# Patient Record
Sex: Male | Born: 1990 | Race: Black or African American | Hispanic: No | Marital: Single | Smoking: Current every day smoker
Health system: Southern US, Community
[De-identification: ages and names within clinical notes are randomized; demographics above are authoritative.]

## PROBLEM LIST (undated history)

## (undated) DIAGNOSIS — I10 Essential (primary) hypertension: Secondary | ICD-10-CM

## (undated) DIAGNOSIS — I517 Cardiomegaly: Secondary | ICD-10-CM

## (undated) DIAGNOSIS — S83519A Sprain of anterior cruciate ligament of unspecified knee, initial encounter: Secondary | ICD-10-CM

## (undated) DIAGNOSIS — J45909 Unspecified asthma, uncomplicated: Secondary | ICD-10-CM

## (undated) HISTORY — PX: ANTERIOR CRUCIATE LIGAMENT REPAIR: SHX115

---

## 2010-08-06 ENCOUNTER — Emergency Department (HOSPITAL_COMMUNITY)
Admission: EM | Admit: 2010-08-06 | Discharge: 2010-08-06 | Payer: Self-pay | Source: Home / Self Care | Admitting: Emergency Medicine

## 2011-09-12 ENCOUNTER — Emergency Department (HOSPITAL_COMMUNITY)
Admission: EM | Admit: 2011-09-12 | Discharge: 2011-09-12 | Disposition: A | Discharge status: Home / Self Care | Payer: Medicaid Other | Attending: Emergency Medicine | Admitting: Emergency Medicine

## 2011-09-12 ENCOUNTER — Encounter (HOSPITAL_COMMUNITY): Payer: Self-pay | Admitting: *Deleted

## 2011-09-12 DIAGNOSIS — F17200 Nicotine dependence, unspecified, uncomplicated: Secondary | ICD-10-CM | POA: Insufficient documentation

## 2011-09-12 DIAGNOSIS — M25569 Pain in unspecified knee: Secondary | ICD-10-CM | POA: Insufficient documentation

## 2011-09-12 DIAGNOSIS — M25561 Pain in right knee: Secondary | ICD-10-CM

## 2011-09-12 MED ORDER — ACETAMINOPHEN 500 MG PO TABS
1000.0000 mg | ORAL_TABLET | Freq: Once | ORAL | Status: DC
  Filled 2011-09-12: qty 2

## 2011-09-12 MED ORDER — ACETAMINOPHEN 325 MG PO TABS
975.0000 mg | ORAL_TABLET | Freq: Once | ORAL | Status: AC
  Administered 2011-09-12: 975 mg via ORAL
  Filled 2011-09-12: qty 3

## 2011-09-12 NOTE — ED Notes (Signed)
Painful rt knee for 2 weeks .  No injury

## 2011-09-12 NOTE — ED Provider Notes (Signed)
History     CSN: [id number]  Arrival date & time 09/12/11  1544   First MD Initiated Contact with Patient 09/12/11 1620      Chief Complaint  Patient presents with  . Knee Pain    (Consider location/radiation/quality/duration/timing/severity/associated sxs/prior treatment) HPI Complains of right knee pain nonradiating for the past 2 weeks no injury pain is worse with walking pain is nonradiating improved with remaining still mild at present no fever patient feels his knee is unstable when he walks stating "it wants to give out" no treatment prior to coming here no other associated symptoms History reviewed. No pertinent past medical history.  History reviewed. No pertinent past surgical history. Anterior cruciate ligament repair, right side; right hamstring repair No family history on file.  History  Substance Use Topics  . Smoking status: Current Everyday Smoker  . Smokeless tobacco: Not on file  . Alcohol Use: No      Review of Systems  Constitutional: Negative.   Eyes: Negative.   Musculoskeletal:       Arthralgias right knee otherwise negative  Neurological: Negative for numbness.  Hematological: Negative.   Psychiatric/Behavioral: Negative.     Allergies  Review of patient's allergies indicates no known allergies.  Home Medications  No current outpatient prescriptions on file.  BP 139/84  Pulse 77  Temp(Src) 98.2 F (36.8 C) (Oral)  Resp 18  SpO2 95%  Physical Exam  Nursing note and vitals reviewed. Constitutional: He appears well-developed and well-nourished. No distress.  Eyes: EOM are normal.  Neck: Neck supple.  Cardiovascular: Normal rate.   Pulmonary/Chest: Effort normal.  Abdominal:       obese  Musculoskeletal: Normal range of motion.       Right lower extremity without redness swelling or tenderness no ligamentous laxity at knee negative Lachman sign or posterior drawer sign; neurovascularly intact no tenderness; all other extremities  nontender neurovascularly intact  Neurological:       Gait normal walks without limp  Psychiatric: He has a normal mood and affect. His behavior is normal. Thought content normal.    ED Course  Procedures (including critical care time)  Labs Reviewed - No data to display No results found.   No diagnosis found.    MDM  Imaging not indicated patient agrees Patient requests only Tylenol or Advil for pain; requests orthopedic referral Diagnosis right knee pain        Doug Sou, MD 09/12/11 1646

## 2012-03-21 ENCOUNTER — Emergency Department (HOSPITAL_COMMUNITY): Payer: Medicaid Other

## 2012-03-21 ENCOUNTER — Encounter (HOSPITAL_COMMUNITY): Payer: Self-pay | Admitting: Emergency Medicine

## 2012-03-21 ENCOUNTER — Emergency Department (HOSPITAL_COMMUNITY)
Admission: EM | Admit: 2012-03-21 | Discharge: 2012-03-22 | Disposition: A | Discharge status: Home / Self Care | Payer: Medicaid Other | Attending: Emergency Medicine | Admitting: Emergency Medicine

## 2012-03-21 DIAGNOSIS — F17200 Nicotine dependence, unspecified, uncomplicated: Secondary | ICD-10-CM | POA: Insufficient documentation

## 2012-03-21 DIAGNOSIS — J45909 Unspecified asthma, uncomplicated: Secondary | ICD-10-CM | POA: Insufficient documentation

## 2012-03-21 DIAGNOSIS — Y9367 Activity, basketball: Secondary | ICD-10-CM | POA: Insufficient documentation

## 2012-03-21 DIAGNOSIS — S8991XA Unspecified injury of right lower leg, initial encounter: Secondary | ICD-10-CM

## 2012-03-21 DIAGNOSIS — S8990XA Unspecified injury of unspecified lower leg, initial encounter: Secondary | ICD-10-CM | POA: Insufficient documentation

## 2012-03-21 DIAGNOSIS — X58XXXA Exposure to other specified factors, initial encounter: Secondary | ICD-10-CM | POA: Insufficient documentation

## 2012-03-21 HISTORY — DX: Unspecified asthma, uncomplicated: J45.909

## 2012-03-21 NOTE — ED Notes (Signed)
Pt [place] basketball court after c/o pain to R knee after playing basketball. Previous ACL on same, ambulatory to [facility]

## 2012-03-22 MED ORDER — KETOROLAC TROMETHAMINE 60 MG/2ML IM SOLN
60.0000 mg | Freq: Once | INTRAMUSCULAR | Status: AC
  Administered 2012-03-22: 60 mg via INTRAMUSCULAR
  Filled 2012-03-22: qty 2

## 2012-03-22 MED ORDER — MELOXICAM 7.5 MG PO TABS: 7.5000 mg | ORAL_TABLET | Freq: Every day | ORAL | Status: DC

## 2012-03-22 NOTE — ED Notes (Addendum)
Pt reports tearing right ACL 2 years ago. Pt reports walking and felt his knee popped out of place and couldn't pop it back in place.

## 2012-03-22 NOTE — ED Notes (Signed)
Called pt x 1, no answer 

## 2012-03-22 NOTE — ED Provider Notes (Signed)
History     CSN: [id number]  Arrival date & time 03/21/12  2046   First MD Initiated Contact with Patient 03/22/12 0214      Chief Complaint  Patient presents with  . Knee Injury    (Consider location/radiation/quality/duration/timing/severity/associated sxs/prior treatment) HPI Comments: 21 year old male with a history of anterior cruciate ligament repair in the past presents with right knee pain after playing basketball and feeling his knee pop. The pain is persistent, mild, worse with range of motion, not associated with swelling numbness or tingling. This is the same knee that had the injury in the past.  The history is provided by the patient.    Past Medical History  Diagnosis Date  . Asthma     Past Surgical History  Procedure Date  . Anterior cruciate ligament repair     R Knee    No family history on file.  History  Substance Use Topics  . Smoking status: Current Everyday Smoker  . Smokeless tobacco: Not on file  . Alcohol Use: No      Review of Systems  Musculoskeletal: Positive for gait problem. Negative for joint swelling.  Neurological: Negative for weakness and numbness.    Allergies  Review of patient's allergies indicates no known allergies.  Home Medications   Current Outpatient Rx  Name Route Sig Dispense Refill  . MELOXICAM 7.5 MG PO TABS Oral Take 1 tablet (7.5 mg total) by mouth daily. 30 tablet 0    BP 119/68  Pulse 96  Temp 99 F (37.2 C) (Oral)  Resp 16  SpO2 97%  Physical Exam  Nursing note and vitals reviewed. Constitutional: He appears well-developed and well-nourished. No distress.  HENT:  Head: Normocephalic and atraumatic.  Eyes: Conjunctivae are normal. No scleral icterus.  Cardiovascular: Intact distal pulses.   Pulmonary/Chest: Effort normal.  Musculoskeletal: He exhibits tenderness ( No anterior or posterior drawer laxity, pain with range of motion of the knee but has a supple knee that flexes and extends to  normal range of motion.). He exhibits no edema.  Neurological: He is alert.  Skin: Skin is warm and dry. No rash noted. He is not diaphoretic.    ED Course  Procedures (including critical care time)  Labs Reviewed - No data to display Dg Knee Complete 4 Views Right  03/21/2012  *RADIOLOGY REPORT*  Clinical Data: Knee pain, history of ACL repair  RIGHT KNEE - COMPLETE 4+ VIEW  Comparison: None.  Findings: Postop changes from ACL repair.  Negative for fracture or effusion.  Small ossified densities along the central aspect of the joint along the tibial spines, loose articular joint bodies not excluded.  IMPRESSION: Previous Biochemist, clinical.  Question small loose articular joint bodies centrally.  Negative for fracture or effusion.  Original Report Authenticated By: Judie Petit. Ruel Favors, M.D.     1. Right knee injury       MDM  Patient has some difficulty with ambulating secondary to pain in the knee, x-rays reviewed showing no signs of acute fractures or dislocations, knee appear stable without significant effusion, orthopedic followup, rice therapy, nonsteroidal medications.  Discharge Prescriptions include:  meloxican          Vida Roller, MD 03/22/12 [phone]

## 2012-03-22 NOTE — ED Notes (Signed)
Called for pt x 2 in [facility] for revitalization, no answer

## 2013-02-01 ENCOUNTER — Encounter (HOSPITAL_COMMUNITY): Payer: Self-pay | Admitting: Emergency Medicine

## 2013-02-01 ENCOUNTER — Emergency Department (HOSPITAL_COMMUNITY)
Admission: EM | Admit: 2013-02-01 | Discharge: 2013-02-01 | Disposition: A | Discharge status: Home / Self Care | Payer: Medicaid Other | Attending: Emergency Medicine | Admitting: Emergency Medicine

## 2013-02-01 ENCOUNTER — Emergency Department (HOSPITAL_COMMUNITY): Payer: Medicaid Other

## 2013-02-01 DIAGNOSIS — M25561 Pain in right knee: Secondary | ICD-10-CM

## 2013-02-01 DIAGNOSIS — S8990XA Unspecified injury of unspecified lower leg, initial encounter: Secondary | ICD-10-CM | POA: Insufficient documentation

## 2013-02-01 DIAGNOSIS — Y929 Unspecified place or not applicable: Secondary | ICD-10-CM | POA: Insufficient documentation

## 2013-02-01 DIAGNOSIS — Y939 Activity, unspecified: Secondary | ICD-10-CM | POA: Insufficient documentation

## 2013-02-01 DIAGNOSIS — R296 Repeated falls: Secondary | ICD-10-CM | POA: Insufficient documentation

## 2013-02-01 DIAGNOSIS — J45909 Unspecified asthma, uncomplicated: Secondary | ICD-10-CM | POA: Insufficient documentation

## 2013-02-01 DIAGNOSIS — S99929A Unspecified injury of unspecified foot, initial encounter: Secondary | ICD-10-CM | POA: Insufficient documentation

## 2013-02-01 DIAGNOSIS — F17200 Nicotine dependence, unspecified, uncomplicated: Secondary | ICD-10-CM | POA: Insufficient documentation

## 2013-02-01 DIAGNOSIS — Z9889 Other specified postprocedural states: Secondary | ICD-10-CM | POA: Insufficient documentation

## 2013-02-01 MED ORDER — NAPROXEN 500 MG PO TABS: 500.0000 mg | ORAL_TABLET | Freq: Two times a day (BID) | ORAL | Status: DC

## 2013-02-01 MED ORDER — HYDROCODONE-ACETAMINOPHEN 5-325 MG PO TABS: 1.0000 | ORAL_TABLET | Freq: Four times a day (QID) | ORAL | Status: DC | PRN

## 2013-02-01 NOTE — ED Notes (Signed)
Per EMS.  Pt fell and hurt knee.  Pt able to walk on knee.  Pt told EMs he felt knee pop, but states his knee pops normally after als surgery several years ago.

## 2013-02-01 NOTE — ED Provider Notes (Signed)
History     CSN: [id number]  Arrival date & time 02/01/13  1344   First MD Initiated Contact with Patient 02/01/13 1408      Chief Complaint  Patient presents with  . Knee Pain    (Consider location/radiation/quality/duration/timing/severity/associated sxs/prior treatment) HPI Comments: Patient presents with right knee pain that has been present since he fell on his knee just prior to arrival.  He reports that his knee has been increasingly unstable.  Today his knee gave out and he fell to the ground and landed on his knee.  Knee pain worse with ROM of the knee.  He denies any swelling or erythema of the knee.  Denies numbness or tingling.  He has been ambulatory since the fall.  He has not taken anything for pain prior to arrival in the ED.   PMH significant for ACL repair surgery of the right knee six years ago.  Surgery was performed in [place].   The history is provided by the patient.    Past Medical History  Diagnosis Date  . Asthma     Past Surgical History  Procedure Laterality Date  . Anterior cruciate ligament repair      R Knee    History reviewed. No pertinent family history.  History  Substance Use Topics  . Smoking status: Current Every Day Smoker  . Smokeless tobacco: Not on file  . Alcohol Use: No      Review of Systems  All other systems reviewed and are negative.    Allergies  Review of patient's allergies indicates no known allergies.  Home Medications  No current outpatient prescriptions on file.  BP 140/88  Pulse 75  Temp(Src) 98 F (36.7 C) (Oral)  Resp 18  SpO2 98%  Physical Exam  Nursing note and vitals reviewed. Constitutional: He appears well-developed and well-nourished. No distress.  HENT:  Head: Normocephalic and atraumatic.  Cardiovascular: Normal rate, regular rhythm, normal heart sounds and intact distal pulses.   Pulmonary/Chest: Effort normal and breath sounds normal.  Musculoskeletal:       Right knee: He  exhibits normal range of motion, no swelling, no effusion, no ecchymosis, no deformity, no laceration, no erythema, no LCL laxity and no MCL laxity. Tenderness found. Medial joint line and lateral joint line tenderness noted.  Mild laxity with Anterior Drawer of both knees.  Neurological: He is alert.  Skin: Skin is warm and dry. He is not diaphoretic. No erythema.  Psychiatric: He has a normal mood and affect.    ED Course  Procedures (including critical care time)  Labs Reviewed - No data to display Dg Knee Complete 4 Views Right  02/01/2013   *RADIOLOGY REPORT*  Clinical Data: Right knee pain for 2 weeks.  History of prior ACL repair.  RIGHT KNEE - COMPLETE 4+ VIEW  Comparison: Plain films 03/21/2012.  Findings: Postoperative change of ACL grafting is again seen. There is no acute bony or joint abnormality.  Loose bodies are again seen within the joint centrally and in the medial compartment.  There is no joint effusion.  IMPRESSION:  1.  No acute finding. 2.  Status post ACL grafting with some loose bodies in the joint. The appearance is stable compared to the prior exam.   Original Report Authenticated By: Holley Dexter, M.D.     No diagnosis found.    MDM  Patient presenting with knee pain after falling earlier today.  He also reports that his knee has been more unstable.  PMH  significant for ACL surgery.  Xray negative.  Neurovascularly intact.  Patient discharged home with short course of pain medication, knee brace, and Orthopedic referral.          Magnus Sinning, PA-C 02/02/13 1228  Pascal Lux Lugoff, PA-C 02/02/13 1229  Pascal Lux [place], [place] 02/02/13 1229

## 2013-02-01 NOTE — ED Notes (Signed)
Unable to discharge, waiting for application of knee sleeve.

## 2013-02-02 NOTE — ED Provider Notes (Signed)
Medical screening examination/treatment/procedure(s) were performed by non-physician practitioner and as supervising physician I was immediately available for consultation/collaboration.  Fatema Rabe, MD 02/02/13 1411 

## 2013-02-07 ENCOUNTER — Emergency Department (HOSPITAL_COMMUNITY)
Admission: EM | Admit: 2013-02-07 | Discharge: 2013-02-07 | Disposition: A | Discharge status: Home / Self Care | Payer: Medicaid Other | Attending: Emergency Medicine | Admitting: Emergency Medicine

## 2013-02-07 ENCOUNTER — Encounter (HOSPITAL_COMMUNITY): Payer: Self-pay | Admitting: Emergency Medicine

## 2013-02-07 DIAGNOSIS — J45909 Unspecified asthma, uncomplicated: Secondary | ICD-10-CM | POA: Insufficient documentation

## 2013-02-07 DIAGNOSIS — Y929 Unspecified place or not applicable: Secondary | ICD-10-CM | POA: Insufficient documentation

## 2013-02-07 DIAGNOSIS — IMO0002 Reserved for concepts with insufficient information to code with codable children: Secondary | ICD-10-CM | POA: Insufficient documentation

## 2013-02-07 DIAGNOSIS — Y9361 Activity, american tackle football: Secondary | ICD-10-CM | POA: Insufficient documentation

## 2013-02-07 DIAGNOSIS — R229 Localized swelling, mass and lump, unspecified: Secondary | ICD-10-CM | POA: Insufficient documentation

## 2013-02-07 DIAGNOSIS — M25569 Pain in unspecified knee: Secondary | ICD-10-CM | POA: Insufficient documentation

## 2013-02-07 DIAGNOSIS — M25561 Pain in right knee: Secondary | ICD-10-CM

## 2013-02-07 DIAGNOSIS — Z791 Long term (current) use of non-steroidal anti-inflammatories (NSAID): Secondary | ICD-10-CM | POA: Insufficient documentation

## 2013-02-07 DIAGNOSIS — F17200 Nicotine dependence, unspecified, uncomplicated: Secondary | ICD-10-CM | POA: Insufficient documentation

## 2013-02-07 DIAGNOSIS — X58XXXA Exposure to other specified factors, initial encounter: Secondary | ICD-10-CM | POA: Insufficient documentation

## 2013-02-07 MED ORDER — HYDROCODONE-ACETAMINOPHEN 5-325 MG PO TABS: 1.0000 | ORAL_TABLET | Freq: Four times a day (QID) | ORAL | Status: DC | PRN

## 2013-02-07 MED ORDER — NAPROXEN 500 MG PO TABS: 500.0000 mg | ORAL_TABLET | Freq: Two times a day (BID) | ORAL | Status: DC

## 2013-02-07 NOTE — ED Notes (Signed)
Pt presenting to ed with c/o right knee pain since high school injury pt states seen here for the same a couple days ago but pain is worse

## 2013-02-07 NOTE — ED Provider Notes (Signed)
History     CSN: [id number]  Arrival date & time 02/07/13  0721   First MD Initiated Contact with Patient 02/07/13 [phone]      Chief Complaint  Patient presents with  . Knee Pain    (Consider location/radiation/quality/duration/timing/severity/associated sxs/prior treatment) HPI  She presents emergency department chief complaint of right knee pain.  He has a past medical history of anterior cruciate ligament tear and repair.  Patient is a former high Pharmacist, community.  The patient was seen here on 02/01/2013 for the same complaint.  He was given pain medication and a knee sleeve.  Patient states that his knee pain appears to be getting worse.  He denies any heat, swelling, redness, history of gout.  The patient denies any fevers, chills, nausea, vomiting.  He has pain with full extension of the knee and full flexion of the knee.  He is ambulatory but with pain.  Past Medical History  Diagnosis Date  . Asthma     Past Surgical History  Procedure Laterality Date  . Anterior cruciate ligament repair      R Knee    No family history on file.  History  Substance Use Topics  . Smoking status: Current Every Day Smoker    Types: Cigarettes  . Smokeless tobacco: Not on file  . Alcohol Use: Yes     Comment: 1/2 gallon every 2 weeks      Review of Systems  Constitutional: Negative for fever and chills.  Respiratory: Negative for cough and shortness of breath.   Cardiovascular: Negative for chest pain and palpitations.  Gastrointestinal: Negative for vomiting, abdominal pain, diarrhea and constipation.  Genitourinary: Negative for dysuria, urgency and frequency.  Musculoskeletal: Positive for joint swelling and gait problem. Negative for myalgias and arthralgias.  Skin: Negative for rash.  Neurological: Negative for numbness and headaches.    Allergies  Review of patient's allergies indicates no known allergies.  Home Medications   Current Outpatient Rx  Name   Route  Sig  Dispense  Refill  . naproxen (NAPROSYN) 500 MG tablet   Oral   Take 1 tablet (500 mg total) by mouth 2 (two) times daily.   30 tablet   0   . HYDROcodone-acetaminophen (NORCO/VICODIN) 5-325 MG per tablet   Oral   Take 1-2 tablets by mouth every 6 (six) hours as needed for pain.   15 tablet   0     BP 140/86  Pulse 82  Temp(Src) 98.1 F (36.7 C) (Oral)  Resp 17  SpO2 100%  Physical Exam  Nursing note and vitals reviewed. Constitutional: He appears well-developed and well-nourished. No distress.  HENT:  Head: Normocephalic and atraumatic.  Eyes: Conjunctivae are normal. No scleral icterus.  Neck: Normal range of motion. Neck supple.  Cardiovascular: Normal rate, regular rhythm and normal heart sounds.   Pulmonary/Chest: Effort normal and breath sounds normal. No respiratory distress.  Abdominal: Soft. There is no tenderness.  Musculoskeletal: He exhibits no edema.  A right knee exam was performed. SKIN: intact SWELLING: minimal EFFUSION: none WARMTH: no warmth TENDERNESS:  diffuse ROM: limited by pain STRENGTH: normal CREPITUS: no NEUROLOGICAL EXAM: normal VASCULAR EXAM: normal   Neurological: He is alert.  Skin: Skin is warm and dry. He is not diaphoretic.  Psychiatric: His behavior is normal.    ED Course  Procedures (including critical care time)  Labs Reviewed - No data to display No results found.   1. Knee joint pain, right  MDM   Filed Vitals:   02/07/13 0832  BP: 140/86  Pulse:   Temp:   Resp: 17   Patient x-ray shows small bone fragments in the knee.  No signs of effusion.  This was taken on 02/01/2013.  Discussed the fact that the patient needs to followup with orthopedics.  Patient states he is referred to Dr. Audrie Lia office but was unable to obtain appointment.  He states that he was told that he was fell and could not get in.  Dr. Ophelia Charter on call today.  He is in the same practice.  I called and spoke with Ms. Cannal.   Patient has appointment tomorrow at 2:45 PM.  He must call with his Medicaid card information.  Visual (knee immobilizer.  I refill his pain medications.  He appears safe for discharge at this time.  Do not suspect a septic joint, gout, no new injuries.Arthor Captain, PA-C 02/07/13 [phone]

## 2013-02-07 NOTE — Progress Notes (Addendum)
[facility] ED CM noted pt without a pcp listed. CM spoke with pt who confirms [place] resident States his pcp is on "[address] "[facility]" and can not recall the name of the MD.  Listed in [facility] for this visit per registration with medicaid but he reports not being able to find his medicaid card.  The MD listed for the facility is george osei bonsu

## 2013-02-08 NOTE — ED Provider Notes (Signed)
Medical screening examination/treatment/procedure(s) were performed by non-physician practitioner and as supervising physician I was immediately available for consultation/collaboration.  Lyanne Co, MD 02/08/13 [phone]

## 2013-06-09 ENCOUNTER — Emergency Department (HOSPITAL_COMMUNITY)
Admission: EM | Admit: 2013-06-09 | Discharge: 2013-06-09 | Disposition: A | Discharge status: Home / Self Care | Payer: Medicaid Other | Attending: Emergency Medicine | Admitting: Emergency Medicine

## 2013-06-09 ENCOUNTER — Encounter (HOSPITAL_COMMUNITY): Payer: Self-pay | Admitting: Emergency Medicine

## 2013-06-09 DIAGNOSIS — Y9389 Activity, other specified: Secondary | ICD-10-CM | POA: Insufficient documentation

## 2013-06-09 DIAGNOSIS — S8990XA Unspecified injury of unspecified lower leg, initial encounter: Secondary | ICD-10-CM | POA: Insufficient documentation

## 2013-06-09 DIAGNOSIS — W010XXA Fall on same level from slipping, tripping and stumbling without subsequent striking against object, initial encounter: Secondary | ICD-10-CM | POA: Insufficient documentation

## 2013-06-09 DIAGNOSIS — Z9889 Other specified postprocedural states: Secondary | ICD-10-CM | POA: Insufficient documentation

## 2013-06-09 DIAGNOSIS — J45909 Unspecified asthma, uncomplicated: Secondary | ICD-10-CM | POA: Insufficient documentation

## 2013-06-09 DIAGNOSIS — F17200 Nicotine dependence, unspecified, uncomplicated: Secondary | ICD-10-CM | POA: Insufficient documentation

## 2013-06-09 DIAGNOSIS — M25561 Pain in right knee: Secondary | ICD-10-CM

## 2013-06-09 DIAGNOSIS — Z79899 Other long term (current) drug therapy: Secondary | ICD-10-CM | POA: Insufficient documentation

## 2013-06-09 DIAGNOSIS — Y9289 Other specified places as the place of occurrence of the external cause: Secondary | ICD-10-CM | POA: Insufficient documentation

## 2013-06-09 MED ORDER — IBUPROFEN 600 MG PO TABS
600.0000 mg | ORAL_TABLET | Freq: Four times a day (QID) | ORAL | Status: DC | PRN
Start: 2013-06-09 — End: 2013-10-26

## 2013-06-09 MED ORDER — HYDROCODONE-ACETAMINOPHEN 5-325 MG PO TABS: ORAL_TABLET | ORAL | Status: DC

## 2013-06-09 NOTE — ED Notes (Signed)
Pt c/o left leg pain for the past three days.  Pt reports falling on the leg at that time.

## 2013-06-09 NOTE — ED Notes (Signed)
Per EMS. Leg pain 3 days, ambulatory. Tripped over shirt, felt pop in leg

## 2013-06-09 NOTE — ED Provider Notes (Signed)
CSN: [id number]     Arrival date & time 06/09/13  1225 History  This chart was scribed for non-physician practitioner working with Benjamin Marion, MD by Ashley Jacobs, ED scribe. This patient was seen in room WTR8/WTR8 and the patient's care was started at 12:48 PM.   First MD Initiated Contact with Patient 06/09/13 1237     Chief Complaint  Patient presents with  . Leg Problem   (Consider location/radiation/quality/duration/timing/severity/associated sxs/prior Treatment) HPI HPI Comments: Benjamin Garcia is a 22 y.o. male who arrives via EMS to the Emergency Department complaining of sharp dorsal left knee pain that started 3 days ago without known injury, pain has gradually worsened and reports pain great today after tripping over a shirt and falling onto the floor. Pt also experiencing right knee pain with a severity of 8/10 and presents with a tingling sensation that extends down to his right calf. The right knee pain is the result of an previous injury that occurred while playing football. He had a R knee anterior cruciate ligament repair. He states the right knee feels tight and the pain is located more towards the frontal region. He is taking naproxen for pain but denies seening any significant improvement. Pt currently smoke tobacco everyday and drinks a 0.5 gallon of alcohol every two weeks.  Past Medical History  Diagnosis Date  . Asthma    Past Surgical History  Procedure Laterality Date  . Anterior cruciate ligament repair      R Knee   History reviewed. No pertinent family history. History  Substance Use Topics  . Smoking status: Current Every Day Smoker    Types: Cigarettes  . Smokeless tobacco: Not on file  . Alcohol Use: Yes     Comment: 1/2 gallon every 2 weeks    Review of Systems  Musculoskeletal: Positive for arthralgias (bilateral knee) and joint swelling.  All other systems reviewed and are negative.    Allergies  Review of patient's allergies indicates no  known allergies.  Home Medications   Current Outpatient Rx  Name  Route  Sig  Dispense  Refill  . doxepin (SINEQUAN) 25 MG capsule   Oral   Take 25 mg by mouth at bedtime.         . fluPHENAZine (PROLIXIN) 2.5 MG tablet   Oral   Take 2.5 mg by mouth daily.         . naproxen (NAPROSYN) 500 MG tablet   Oral   Take 1 tablet (500 mg total) by mouth 2 (two) times daily.   30 tablet   0   . HYDROcodone-acetaminophen (NORCO/VICODIN) 5-325 MG per tablet      Take 1 pill every 6 hours as needed for pain.   6 tablet   0   . ibuprofen (ADVIL,MOTRIN) 600 MG tablet   Oral   Take 1 tablet (600 mg total) by mouth every 6 (six) hours as needed for pain.   30 tablet   0    BP 135/83  Pulse 98  Temp(Src) 98.4 F (36.9 C) (Oral)  Resp 16  Ht 5\' 10"  (1.778 m)  Wt 270 lb (122.471 kg)  BMI 38.74 kg/m2  SpO2 97% Physical Exam  Nursing note and vitals reviewed. Constitutional: He is oriented to person, place, and time. He appears well-developed and well-nourished.  HENT:  Head: Normocephalic and atraumatic.  Eyes: EOM are normal.  Neck: Normal range of motion.  Cardiovascular: Normal rate.   Pulmonary/Chest: Effort normal.  Musculoskeletal: Normal range  of motion. He exhibits tenderness.  Tenderness to the posterior left knee left knee: Full ROM  No edema No crepitus R-knee: well healed surgical scar.  Moderate edema interior aspect of R knee Full ROM  Tender to palpation No erythema or warmth   Neurological: He is alert and oriented to person, place, and time.  Skin: Skin is warm and dry. No erythema.  Psychiatric: He has a normal mood and affect. His behavior is normal.    ED Course  Procedures (including critical care time) DIAGNOSTIC STUDIES: Oxygen Saturation is 97% on room air, normal by my interpretation.    COORDINATION OF CARE: 12:52 PM Discussed course of care with pt . Pt understands and agrees.  Labs Review Labs Reviewed - No data to  display Imaging Review No results found.  EKG Interpretation   None       MDM   1. Bilateral knee pain    Pt presenting with bilateral knee pain.  Pain in left knee is posterior.  No ecchymosis, erythema, edema, or crepitus.  Do not believe imaging of left knee is needed at this time.  Pt also c/o chronic right knee pain but states he is waiting to see his PCP to be referred to an orthopedist as required by his insurance.   Rx: 2 knee sleeves. norco and ibuprofen. Advised to use R.I.C.E treatment to help with pain.  Return precautions provided. Pt verbalized understanding and agreement with tx plan.   I personally performed the services described in this documentation, which was scribed in my presence. The recorded information has been reviewed and is accurate.    Junius Finner, PA-C 06/09/13 1736

## 2013-06-10 NOTE — ED Provider Notes (Signed)
Medical screening examination/treatment/procedure(s) were performed by non-physician practitioner and as supervising physician I was immediately available for consultation/collaboration.  EKG Interpretation   None         [name], MD 06/10/13 [phone]

## 2013-08-18 DIAGNOSIS — I517 Cardiomegaly: Secondary | ICD-10-CM

## 2013-08-18 DIAGNOSIS — I10 Essential (primary) hypertension: Secondary | ICD-10-CM

## 2013-08-18 HISTORY — DX: Essential (primary) hypertension: I10

## 2013-08-18 HISTORY — DX: Cardiomegaly: I51.7

## 2013-10-26 ENCOUNTER — Encounter (HOSPITAL_COMMUNITY): Payer: Self-pay | Admitting: Emergency Medicine

## 2013-10-26 ENCOUNTER — Emergency Department (HOSPITAL_COMMUNITY): Payer: Medicaid Other

## 2013-10-26 ENCOUNTER — Emergency Department (HOSPITAL_COMMUNITY)
Admission: EM | Admit: 2013-10-26 | Discharge: 2013-10-26 | Disposition: A | Discharge status: Home / Self Care | Payer: Medicaid Other | Attending: Emergency Medicine | Admitting: Emergency Medicine

## 2013-10-26 DIAGNOSIS — R079 Chest pain, unspecified: Secondary | ICD-10-CM

## 2013-10-26 DIAGNOSIS — J45909 Unspecified asthma, uncomplicated: Secondary | ICD-10-CM | POA: Insufficient documentation

## 2013-10-26 DIAGNOSIS — R071 Chest pain on breathing: Secondary | ICD-10-CM | POA: Insufficient documentation

## 2013-10-26 DIAGNOSIS — I10 Essential (primary) hypertension: Secondary | ICD-10-CM | POA: Insufficient documentation

## 2013-10-26 DIAGNOSIS — F17200 Nicotine dependence, unspecified, uncomplicated: Secondary | ICD-10-CM | POA: Insufficient documentation

## 2013-10-26 DIAGNOSIS — Z79899 Other long term (current) drug therapy: Secondary | ICD-10-CM | POA: Insufficient documentation

## 2013-10-26 HISTORY — DX: Essential (primary) hypertension: I10

## 2013-10-26 HISTORY — DX: Cardiomegaly: I51.7

## 2013-10-26 LAB — CBC
HEMATOCRIT: 44 % (ref 39.0–52.0)
Hemoglobin: 15.4 g/dL (ref 13.0–17.0)
MCH: 27.6 pg (ref 26.0–34.0)
MCHC: 35 g/dL (ref 30.0–36.0)
MCV: 78.9 fL (ref 78.0–100.0)
PLATELETS: 254 10*3/uL (ref 150–400)
RBC: 5.58 MIL/uL (ref 4.22–5.81)
RDW: 13.9 % (ref 11.5–15.5)
WBC: 6.7 10*3/uL (ref 4.0–10.5)

## 2013-10-26 LAB — I-STAT TROPONIN, ED: TROPONIN I, POC: 0 ng/mL (ref 0.00–0.08)

## 2013-10-26 LAB — BASIC METABOLIC PANEL
BUN: 8 mg/dL (ref 6–23)
CO2: 25 mEq/L (ref 19–32)
CREATININE: 0.71 mg/dL (ref 0.50–1.35)
Calcium: 9.7 mg/dL (ref 8.4–10.5)
Chloride: 101 mEq/L (ref 96–112)
Glucose, Bld: 87 mg/dL (ref 70–99)
Potassium: 4.2 mEq/L (ref 3.7–5.3)
Sodium: 137 mEq/L (ref 137–147)

## 2013-10-26 MED ORDER — KETOROLAC TROMETHAMINE 30 MG/ML IJ SOLN
30.0000 mg | Freq: Once | INTRAMUSCULAR | Status: AC
  Administered 2013-10-26: 30 mg via INTRAVENOUS
  Filled 2013-10-26: qty 1

## 2013-10-26 MED ORDER — IBUPROFEN 800 MG PO TABS: 800.0000 mg | ORAL_TABLET | Freq: Three times a day (TID) | ORAL | Status: DC

## 2013-10-26 NOTE — ED Provider Notes (Signed)
CSN: [id number]     Arrival date & time 10/26/13  1241 History   First MD Initiated Contact with Patient 10/26/13 1356     Chief Complaint  Patient presents with  . Chest Pain     (Consider location/radiation/quality/duration/timing/severity/associated sxs/prior Treatment) HPI Comments: Patient presents emergency department with chief complaint of chest pain. States the pain started last night. States that the pain has lasted until this morning. He still reports that out of 10 chest pain. It is worsened with palpation. There is no associated diaphoresis or shortness of breath. Patient states pain is worse when he lies down, better when he sits up. He denies any known mechanism of injury. States the pain is 8/10. The pain does not radiate.  The history is provided by the patient. No language interpreter was used.    Past Medical History  Diagnosis Date  . Asthma   . Hypertension 08/2013  . 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 Anterior cruciate ligament repair      R Knee   No family history on file. History  Substance Use Topics  . Smoking status: Current Every Day Smoker -- 0.50 packs/day    Types: Cigarettes  . Smokeless tobacco: Not on file  . Alcohol Use: Yes     Comment: 1/2 gallon every 2 weeks    Review of Systems  All other systems reviewed and are negative.      Allergies  Review of patient's allergies indicates no known allergies.  Home Medications   Current Outpatient Rx  Name  Route  Sig  Dispense  Refill  . doxepin (SINEQUAN) 25 MG capsule   Oral   Take 25 mg by mouth at bedtime.         . fluPHENAZine (PROLIXIN) 2.5 MG tablet   Oral   Take 2.5 mg by mouth daily.          BP 155/86  Pulse 82  Temp(Src) 97.6 F (36.4 C) (Oral)  Resp 14  SpO2 99% Physical Exam  Nursing note and vitals reviewed. Constitutional: He is oriented to person, place, and time. He appears well-developed and well-nourished.   HENT:  Head: Normocephalic and atraumatic.  Eyes: Conjunctivae and EOM are normal. Pupils are equal, round, and reactive to light. Right eye exhibits no discharge. Left eye exhibits no discharge. No scleral icterus.  Neck: Normal range of motion. Neck supple. No JVD present.  Cardiovascular: Normal rate, regular rhythm and normal heart sounds.  Exam reveals no gallop and no friction rub.   No murmur heard. Pulmonary/Chest: Effort normal and breath sounds normal. No respiratory distress. He has no wheezes. He has no rales. He exhibits tenderness.  Anterior chest wall tenderness palpation  Abdominal: Soft. He exhibits no distension and no mass. There is no tenderness. There is no rebound and no guarding.  Musculoskeletal: Normal range of motion. He exhibits no edema and no tenderness.  Neurological: He is alert and oriented to person, place, and time.  Skin: Skin is warm and dry.  Psychiatric: He has a normal mood and affect. His behavior is normal. Judgment and thought content normal.    ED Course  Procedures (including critical care time) Results for orders placed during the hospital encounter of 10/26/13  CBC      Result Value Ref Range   WBC 6.7  4.0 - 10.5 K/uL   RBC 5.58  4.22 - 5.81 MIL/uL   Hemoglobin 15.4  13.0 -  17.0 g/dL   HCT 32.444.0  40.139.0 - 02.752.0 %   MCV 78.9  78.0 - 100.0 fL   MCH 27.6  26.0 - 34.0 pg   MCHC 35.0  30.0 - 36.0 g/dL   RDW 25.313.9  66.411.5 - 40.315.5 %   Platelets 254  150 - 400 K/uL  BASIC METABOLIC PANEL      Result Value Ref Range   Sodium 137  137 - 147 mEq/L   Potassium 4.2  3.7 - 5.3 mEq/L   Chloride 101  96 - 112 mEq/L   CO2 25  19 - 32 mEq/L   Glucose, Bld 87  70 - 99 mg/dL   BUN 8  6 - 23 mg/dL   Creatinine, Ser 4.740.71  0.50 - 1.35 mg/dL   Calcium 9.7  8.4 - 25.910.5 mg/dL   GFR calc non Af Amer >90  >90 mL/min   GFR calc Af Amer >90  >90 mL/min  I-STAT TROPOININ, ED      Result Value Ref Range   Troponin i, poc 0.00  0.00 - 0.08 ng/mL   Comment 3             Dg Chest 2 View  10/26/2013   CLINICAL DATA Chest pain  EXAM CHEST  2 VIEW  COMPARISON None.  FINDINGS The heart size and mediastinal contours are within normal limits. Both lungs are clear. The visualized skeletal structures are unremarkable.  IMPRESSION No active cardiopulmonary disease.  SIGNATURE  Electronically Signed   By: Marlan Palauharles  Clark M.D.   On: 10/26/2013 13:13    Imaging Review Dg Chest 2 View  10/26/2013   CLINICAL DATA Chest pain  EXAM CHEST  2 VIEW  COMPARISON None.  FINDINGS The heart size and mediastinal contours are within normal limits. Both lungs are clear. The visualized skeletal structures are unremarkable.  IMPRESSION No active cardiopulmonary disease.  SIGNATURE  Electronically Signed   By: Marlan Palauharles  Clark M.D.   On: 10/26/2013 13:13     EKG Interpretation None     ED ECG REPORT  I personally interpreted this EKG   Date: 10/26/2013   Rate: 85  Rhythm: normal sinus rhythm  QRS Axis: normal  Intervals: normal  ST/T Wave abnormalities: normal  Conduction Disutrbances:none  Narrative Interpretation:   Old EKG Reviewed: none available    MDM   Final diagnoses:  Chest pain    23 year old male with chest pain. Doubt ACS.  Heart score is 1. PERC negative.  Pain is worsened with palpation.  Labs a reassuring. EKG is normal. Chest x-ray is negative. Pain is now 0/10 after Toradol shot. Patient is on a parent distress. Discharged to home with PCP followup. Patient understands and agrees with the plan. He is stable and ready for discharge.  Filed Vitals:   10/26/13 1415  BP: 159/84  Pulse: 84  Temp:   Resp: [id number]17       Chelcey Caputo, PA-C 10/26/13 [phone]

## 2013-10-26 NOTE — Discharge Instructions (Signed)
Chest Pain (Nonspecific) °It is often hard to give a specific diagnosis for the cause of chest pain. There is always a chance that your pain could be related to something serious, such as a heart attack or a blood clot in the lungs. You need to follow up with your caregiver for further evaluation. °CAUSES  °· Heartburn. °· Pneumonia or bronchitis. °· Anxiety or stress. °· Inflammation around your heart (pericarditis) or lung (pleuritis or pleurisy). °· A blood clot in the lung. °· A collapsed lung (pneumothorax). It can develop suddenly on its own (spontaneous pneumothorax) or from injury (trauma) to the chest. °· Shingles infection (herpes zoster virus). °The chest wall is composed of bones, muscles, and cartilage. Any of these can be the source of the pain. °· The bones can be bruised by injury. °· The muscles or cartilage can be strained by coughing or overwork. °· The cartilage can be affected by inflammation and become sore (costochondritis). °DIAGNOSIS  °Lab tests or other studies, such as X-rays, electrocardiography, stress testing, or cardiac imaging, may be needed to find the cause of your pain.  °TREATMENT  °· Treatment depends on what may be causing your chest pain. Treatment may include: °· Acid blockers for heartburn. °· Anti-inflammatory medicine. °· Pain medicine for inflammatory conditions. °· Antibiotics if an infection is present. °· You may be advised to change lifestyle habits. This includes stopping smoking and avoiding alcohol, caffeine, and chocolate. °· You may be advised to keep your head raised (elevated) when sleeping. This reduces the chance of acid going backward from your stomach into your esophagus. °· Most of the time, nonspecific chest pain will improve within 2 to 3 days with rest and mild pain medicine. °HOME CARE INSTRUCTIONS  °· If antibiotics were prescribed, take your antibiotics as directed. Finish them even if you start to feel better. °· For the next few days, avoid physical  activities that bring on chest pain. Continue physical activities as directed. °· Do not smoke. °· Avoid drinking alcohol. °· Only take over-the-counter or prescription medicine for pain, discomfort, or fever as directed by your caregiver. °· Follow your caregiver's suggestions for further testing if your chest pain does not go away. °· Keep any follow-up appointments you made. If you do not go to an appointment, you could develop lasting (chronic) problems with pain. If there is any problem keeping an appointment, you must call to reschedule. °SEEK MEDICAL CARE IF:  °· You think you are having problems from the medicine you are taking. Read your medicine instructions carefully. °· Your chest pain does not go away, even after treatment. °· You develop a rash with blisters on your chest. °SEEK IMMEDIATE MEDICAL CARE IF:  °· You have increased chest pain or pain that spreads to your arm, neck, jaw, back, or abdomen. °· You develop shortness of breath, an increasing cough, or you are coughing up blood. °· You have severe back or abdominal pain, feel nauseous, or vomit. °· You develop severe weakness, fainting, or chills. °· You have a fever. °THIS IS AN EMERGENCY. Do not wait to see if the pain will go away. Get medical help at once. Call your local emergency services (911 in [place] Do not drive yourself to the hospital. °MAKE SURE YOU:  °· Understand these instructions. °· Will watch your condition. °· Will get help right away if you are not doing well or get worse. °Document Released: 05/14/2005 Document Revised: 10/27/2011 Document Reviewed: 03/09/2008 °ExitCare® Patient Information ©2014 [employer]   [employer]. ° °

## 2013-10-26 NOTE — ED Provider Notes (Signed)
Medical screening examination/treatment/procedure(s) were performed by non-physician practitioner and as supervising physician I was immediately available for consultation/collaboration.  [facility] Teegarden L Oliver Heitzenrater, MD 10/26/13 1548 

## 2013-10-26 NOTE — ED Notes (Signed)
Per EMS - 12 lead showing NSR. Pt c/o CP that started last night, got better then this morning while walking to the store the CP returned, denies radiation/increased pain with palpation. Recent dx with HTN and enlarged heart. EMS started a 20 G in left hand. 324mg  of ASA and 1 SL Nitro. No change in pain after Nitro. Non-diaphoretic. Nad, skin warm and dry, resp e/u.

## 2013-10-27 NOTE — Progress Notes (Signed)
Nsg Discharge Note  Admit Date:  10/26/2013 Discharge date: 10/27/2013   Benjamin Garcia to be D/C'd Home per MD order.  AVS completed.  Copy for chart, and copy for patient signed, and dated. Patient/caregiver able to verbalize understanding.  Discharge Medication:   Medication List    TAKE these medications       ibuprofen 800 MG tablet  Commonly known as:  ADVIL,MOTRIN  Take 1 tablet (800 mg total) by mouth 3 (three) times daily.      ASK your doctor about these medications       doxepin 25 MG capsule  Commonly known as:  SINEQUAN  Take 25 mg by mouth at bedtime.     fluPHENAZine 2.5 MG tablet  Commonly known as:  PROLIXIN  Take 2.5 mg by mouth daily.        Discharge Assessment: Filed Vitals:   10/26/13 1500  BP: 161/84  Pulse: 82  Temp:   Resp: 15   Skin clean, dry and intact without evidence of skin break down, no evidence of skin tears noted. IV catheter discontinued intact. Site without signs and symptoms of complications - no redness or edema noted at insertion site, patient denies c/o pain - only slight tenderness at site.  Dressing with slight pressure applied.  D/c Instructions-Education: Patient picked up by [facility] and discharge instructions sent with him. TAken down by wheelchair no problems noted.    Tacy Chavis Consuella Loselaine, RN 10/27/2013 5:28 PM

## 2013-10-29 ENCOUNTER — Encounter (HOSPITAL_COMMUNITY): Payer: Self-pay | Admitting: Emergency Medicine

## 2013-10-29 ENCOUNTER — Emergency Department (HOSPITAL_COMMUNITY)
Admission: EM | Admit: 2013-10-29 | Discharge: 2013-10-29 | Disposition: A | Discharge status: Home / Self Care | Payer: Medicaid Other | Attending: Emergency Medicine | Admitting: Emergency Medicine

## 2013-10-29 DIAGNOSIS — R109 Unspecified abdominal pain: Secondary | ICD-10-CM

## 2013-10-29 DIAGNOSIS — F17200 Nicotine dependence, unspecified, uncomplicated: Secondary | ICD-10-CM | POA: Insufficient documentation

## 2013-10-29 DIAGNOSIS — J45909 Unspecified asthma, uncomplicated: Secondary | ICD-10-CM | POA: Insufficient documentation

## 2013-10-29 DIAGNOSIS — Z79899 Other long term (current) drug therapy: Secondary | ICD-10-CM | POA: Insufficient documentation

## 2013-10-29 DIAGNOSIS — R1084 Generalized abdominal pain: Secondary | ICD-10-CM | POA: Insufficient documentation

## 2013-10-29 DIAGNOSIS — I10 Essential (primary) hypertension: Secondary | ICD-10-CM | POA: Insufficient documentation

## 2013-10-29 DIAGNOSIS — Z791 Long term (current) use of non-steroidal anti-inflammatories (NSAID): Secondary | ICD-10-CM | POA: Insufficient documentation

## 2013-10-29 DIAGNOSIS — R042 Hemoptysis: Secondary | ICD-10-CM | POA: Insufficient documentation

## 2013-10-29 DIAGNOSIS — R1110 Vomiting, unspecified: Secondary | ICD-10-CM | POA: Insufficient documentation

## 2013-10-29 LAB — URINALYSIS, ROUTINE W REFLEX MICROSCOPIC
Bilirubin Urine: NEGATIVE
GLUCOSE, UA: NEGATIVE mg/dL
Hgb urine dipstick: NEGATIVE
KETONES UR: NEGATIVE mg/dL
LEUKOCYTES UA: NEGATIVE
NITRITE: NEGATIVE
PH: 6 (ref 5.0–8.0)
Protein, ur: NEGATIVE mg/dL
Specific Gravity, Urine: 1.018 (ref 1.005–1.030)
Urobilinogen, UA: 0.2 mg/dL (ref 0.0–1.0)

## 2013-10-29 LAB — CBC
HCT: 43.2 % (ref 39.0–52.0)
Hemoglobin: 14.8 g/dL (ref 13.0–17.0)
MCH: 26.7 pg (ref 26.0–34.0)
MCHC: 34.3 g/dL (ref 30.0–36.0)
MCV: 78 fL (ref 78.0–100.0)
PLATELETS: 273 10*3/uL (ref 150–400)
RBC: 5.54 MIL/uL (ref 4.22–5.81)
RDW: 13.6 % (ref 11.5–15.5)
WBC: 6 10*3/uL (ref 4.0–10.5)

## 2013-10-29 LAB — COMPREHENSIVE METABOLIC PANEL
ALBUMIN: 3.8 g/dL (ref 3.5–5.2)
ALK PHOS: 121 U/L — AB (ref 39–117)
ALT: 22 U/L (ref 0–53)
AST: 19 U/L (ref 0–37)
BUN: 11 mg/dL (ref 6–23)
CO2: 23 mEq/L (ref 19–32)
Calcium: 9.7 mg/dL (ref 8.4–10.5)
Chloride: 100 mEq/L (ref 96–112)
Creatinine, Ser: 0.87 mg/dL (ref 0.50–1.35)
GFR calc Af Amer: >90 mL/min (ref >90)
GFR calc non Af Amer: >90 mL/min (ref >90)
Glucose, Bld: 142 mg/dL — ABNORMAL HIGH (ref 70–99)
POTASSIUM: 4.1 meq/L (ref 3.7–5.3)
Sodium: 137 mEq/L (ref 137–147)
Total Bilirubin: 0.2 mg/dL — ABNORMAL LOW (ref 0.3–1.2)
Total Protein: 8 g/dL (ref 6.0–8.3)

## 2013-10-29 LAB — LIPASE, BLOOD: LIPASE: 19 U/L (ref 11–59)

## 2013-10-29 LAB — TYPE AND SCREEN
ABO/RH(D): B POS
Antibody Screen: NEGATIVE

## 2013-10-29 LAB — ABO/RH: ABO/RH(D): B POS

## 2013-10-29 MED ORDER — HYDROMORPHONE HCL PF 1 MG/ML IJ SOLN
0.5000 mg | INTRAMUSCULAR | Status: DC | PRN
  Administered 2013-10-29: 0.5 mg via INTRAVENOUS
  Filled 2013-10-29: qty 1

## 2013-10-29 MED ORDER — ONDANSETRON HCL 4 MG PO TABS: 4.0000 mg | ORAL_TABLET | Freq: Four times a day (QID) | ORAL | Status: DC

## 2013-10-29 MED ORDER — SODIUM CHLORIDE 0.9 % IV SOLN
1000.0000 mL | Freq: Once | INTRAVENOUS | Status: AC
  Administered 2013-10-29: 1000 mL via INTRAVENOUS

## 2013-10-29 MED ORDER — SODIUM CHLORIDE 0.9 % IV SOLN
1000.0000 mL | INTRAVENOUS | Status: DC
  Administered 2013-10-29: 1000 mL via INTRAVENOUS

## 2013-10-29 MED ORDER — PANTOPRAZOLE SODIUM 20 MG PO TBEC: 20.0000 mg | DELAYED_RELEASE_TABLET | Freq: Every day | ORAL | Status: DC

## 2013-10-29 MED ORDER — ONDANSETRON HCL 4 MG/2ML IJ SOLN
4.0000 mg | Freq: Once | INTRAMUSCULAR | Status: AC
  Administered 2013-10-29: 4 mg via INTRAVENOUS
  Filled 2013-10-29: qty 2

## 2013-10-29 NOTE — ED Notes (Signed)
EMS reports pt developed abd pain last night prior to bed time. States he reports vomiting blood 8 times between 9p and 12mn then 2 x this am. Pt poor historian. Appears in no distress.

## 2013-10-29 NOTE — ED Notes (Signed)
Per pt, he has been vomiting blood since last night.  8 times yesterday and 3-4 times today.  Pt claims large amounts at a time.  Denies heavy drinking. No hx of same. Denies reflux or gastric ulcers.

## 2013-10-29 NOTE — Discharge Instructions (Signed)
Abdominal Pain, Adult °Many things can cause abdominal pain. Usually, abdominal pain is not caused by a disease and will improve without treatment. It can often be observed and treated at home. Your health care provider will do a physical exam and possibly order blood tests and X-rays to help determine the seriousness of your pain. However, in many cases, more time must pass before a clear cause of the pain can be found. Before that point, your health care provider may not know if you need more testing or further treatment. °HOME CARE INSTRUCTIONS  °Monitor your abdominal pain for any changes. The following actions may help to alleviate any discomfort you are experiencing: °· Only take over-the-counter or prescription medicines as directed by your health care provider. °· Do not take laxatives unless directed to do so by your health care provider. °· Try a clear liquid diet (broth, tea, or water) as directed by your health care provider. Slowly move to a bland diet as tolerated. °SEEK MEDICAL CARE IF: °· You have unexplained abdominal pain. °· You have abdominal pain associated with nausea or diarrhea. °· You have pain when you urinate or have a bowel movement. °· You experience abdominal pain that wakes you in the night. °· You have abdominal pain that is worsened or improved by eating food. °· You have abdominal pain that is worsened with eating fatty foods. °SEEK IMMEDIATE MEDICAL CARE IF:  °· Your pain does not go away within 2 hours. °· You have a fever. °· You keep throwing up (vomiting). °· Your pain is felt only in portions of the abdomen, such as the right side or the left lower portion of the abdomen. °· You pass bloody or black tarry stools. °MAKE SURE YOU: °· Understand these instructions.   °· Will watch your condition.   °· Will get help right away if you are not doing well or get worse.   °Document Released: 05/14/2005 Document Revised: 05/25/2013 Document Reviewed: 04/13/2013 °ExitCare® Patient  Information ©2014 [employer]. ° °

## 2013-10-29 NOTE — ED Provider Notes (Signed)
CSN: [id number]     Arrival date & time 10/29/13  1217 History  First MD Initiated Contact with Patient 10/29/13 1227     Chief Complaint  Patient presents with  . Abdominal Pain  . Hemoptysis   Patient is a 23 y.o. male presenting with abdominal pain. The history is provided by the patient.  Abdominal Pain Pain location:  Generalized Pain quality: sharp   Pain severity:  Moderate Onset quality:  Gradual Duration:  1 day Timing:  Constant Progression:  Worsening Context: not alcohol use, not medication withdrawal, not previous surgeries and not sick contacts   Relieved by:  Nothing Ineffective treatments:  None tried Associated symptoms: vomiting (pt did notice blood in the emesis yesterday, none noted today)   Associated symptoms: no chest pain (pt had pain in his chest the otehr day but that has resolved) and no fever     Past Medical History  Diagnosis Date  . Asthma   . Hypertension 08/2013  . 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 Anterior cruciate ligament repair      R Knee   History reviewed. No pertinent family history. History  Substance Use Topics  . Smoking status: Current Every Day Smoker -- 0.50 packs/day    Types: Cigarettes  . Smokeless tobacco: Not on file  . Alcohol Use: Yes     Comment: 1/2 gallon every 2 weeks    Review of Systems  Constitutional: Negative for fever.  Cardiovascular: Negative for chest pain (pt had pain in his chest the otehr day but that has resolved).  Gastrointestinal: Positive for vomiting (pt did notice blood in the emesis yesterday, none noted today) and abdominal pain. Negative for blood in stool.      Allergies  Review of patient's allergies indicates no known allergies.  Home Medications   Current Outpatient Rx  Name  Route  Sig  Dispense  Refill  . doxepin (SINEQUAN) 25 MG capsule   Oral   Take 25 mg by mouth at bedtime.         . fluPHENAZine (PROLIXIN) 2.5 MG tablet  Oral   Take 2.5 mg by mouth daily.         Marland Kitchen. ibuprofen (ADVIL,MOTRIN) 800 MG tablet   Oral   Take 1 tablet (800 mg total) by mouth 3 (three) times daily.   21 tablet   0   . ondansetron (ZOFRAN) 4 MG tablet   Oral   Take 1 tablet (4 mg total) by mouth every 6 (six) hours.   12 tablet   0   . pantoprazole (PROTONIX) 20 MG tablet   Oral   Take 1 tablet (20 mg total) by mouth daily.   14 tablet   0    BP 131/75  Pulse 83  Temp(Src) 98.2 F (36.8 C) (Oral)  Resp 18  SpO2 98% Physical Exam  Nursing note and vitals reviewed. Constitutional: He appears well-developed and well-nourished. No distress.  HENT:  Head: Normocephalic and atraumatic.  Right Ear: External ear normal.  Left Ear: External ear normal.  Eyes: Conjunctivae are normal. Right eye exhibits no discharge. Left eye exhibits no discharge. No scleral icterus.  Neck: Neck supple. No tracheal deviation present.  Cardiovascular: Normal rate, regular rhythm and intact distal pulses.   Pulmonary/Chest: Effort normal and breath sounds normal. No stridor. No respiratory distress. He has no wheezes. He has no rales.  Abdominal: Soft. Bowel sounds are normal. He exhibits no  distension. There is generalized tenderness. There is guarding. There is no rigidity and no rebound.  Musculoskeletal: He exhibits no edema and no tenderness.  Neurological: He is alert. He has normal strength. No cranial nerve deficit (no facial droop, extraocular movements intact, no slurred speech) or sensory deficit. He exhibits normal muscle tone. He displays no seizure activity. Coordination normal.  Skin: Skin is warm and dry. No rash noted.  Psychiatric: He has a normal mood and affect.    ED Course  Procedures (including critical care time) Labs Review Labs Reviewed  COMPREHENSIVE METABOLIC PANEL - Abnormal; Notable for the following:    Glucose, Bld 142 (*)    Alkaline Phosphatase 121 (*)    Total Bilirubin 0.2 (*)    All other  components within normal limits  CBC  LIPASE, BLOOD  URINALYSIS, ROUTINE W REFLEX MICROSCOPIC  TYPE AND SCREEN  ABO/RH   Imaging Review No results found.   EKG Interpretation   Date/Time:  Saturday October 29 2013 12:21:48 EDT Ventricular Rate:  83 PR Interval:  160 QRS Duration: 93 QT Interval:  351 QTC Calculation: 412 R Axis:   52 Text Interpretation:  Sinus rhythm Probable left atrial enlargement No  significant change since last tracing Confirmed by Seab Axel  MD-J, Unita Detamore  ([id number]) on 10/29/2013 12:34:07 PM     Medications  0.9 %  sodium chloride infusion (0 mLs Intravenous Stopped 10/29/13 1354)    Followed by  0.9 %  sodium chloride infusion (0 mLs Intravenous Stopped 10/29/13 1504)  HYDROmorphone (DILAUDID) injection 0.5 mg (0.5 mg Intravenous Given 10/29/13 1315)  ondansetron (ZOFRAN) injection 4 mg (4 mg Intravenous Given 10/29/13 1315)    MDM   Final diagnoses:  Abdominal pain    Pt with complaints of emesis containing blood streaks and abdominal pain.  No  Emesis here in the ED. patient's labs are normal. Is possible his symptoms may be related to gastritis. I doubt significant ulcer bleeding. Patient never had prior abdominal surgeries. Bowel obstruction. He does not have focal right lower quadrant tenderness without appendicitis. Patient had chest discomfort a few days ago. It is possible the symptoms may be related. I will put him on antacids and something for nausea. Warning signs and precautions were discussed    Celene Kras, MD 10/29/13 1535

## 2013-10-29 NOTE — ED Notes (Signed)
Bed: RESA Expected date: 10/29/13 Expected time: 12:20 PM Means of arrival: Ambulance Comments: Abd pain, vomiting bright blood

## 2013-11-04 ENCOUNTER — Encounter (HOSPITAL_COMMUNITY): Payer: Self-pay | Admitting: Emergency Medicine

## 2013-11-04 DIAGNOSIS — W010XXA Fall on same level from slipping, tripping and stumbling without subsequent striking against object, initial encounter: Secondary | ICD-10-CM | POA: Insufficient documentation

## 2013-11-04 DIAGNOSIS — Y9389 Activity, other specified: Secondary | ICD-10-CM | POA: Insufficient documentation

## 2013-11-04 DIAGNOSIS — J45909 Unspecified asthma, uncomplicated: Secondary | ICD-10-CM | POA: Insufficient documentation

## 2013-11-04 DIAGNOSIS — S20219A Contusion of unspecified front wall of thorax, initial encounter: Secondary | ICD-10-CM | POA: Insufficient documentation

## 2013-11-04 DIAGNOSIS — I517 Cardiomegaly: Secondary | ICD-10-CM | POA: Insufficient documentation

## 2013-11-04 DIAGNOSIS — F17200 Nicotine dependence, unspecified, uncomplicated: Secondary | ICD-10-CM | POA: Insufficient documentation

## 2013-11-04 DIAGNOSIS — W1809XA Striking against other object with subsequent fall, initial encounter: Secondary | ICD-10-CM | POA: Insufficient documentation

## 2013-11-04 DIAGNOSIS — I10 Essential (primary) hypertension: Secondary | ICD-10-CM | POA: Insufficient documentation

## 2013-11-04 DIAGNOSIS — Z79899 Other long term (current) drug therapy: Secondary | ICD-10-CM | POA: Insufficient documentation

## 2013-11-04 DIAGNOSIS — Y929 Unspecified place or not applicable: Secondary | ICD-10-CM | POA: Insufficient documentation

## 2013-11-04 NOTE — ED Notes (Signed)
Pt st's he tripped and fell this am hitting his chest on the floor.  Pt c/o pain in epigastric region

## 2013-11-05 ENCOUNTER — Emergency Department (HOSPITAL_COMMUNITY)
Admission: EM | Admit: 2013-11-05 | Discharge: 2013-11-05 | Disposition: A | Discharge status: Home / Self Care | Payer: Medicaid Other | Attending: Emergency Medicine | Admitting: Emergency Medicine

## 2013-11-05 DIAGNOSIS — S20219A Contusion of unspecified front wall of thorax, initial encounter: Secondary | ICD-10-CM

## 2013-11-05 MED ORDER — NAPROXEN 250 MG PO TABS
500.0000 mg | ORAL_TABLET | Freq: Once | ORAL | Status: AC
  Administered 2013-11-05: 500 mg via ORAL
  Filled 2013-11-05: qty 2

## 2013-11-05 MED ORDER — NAPROXEN 500 MG PO TABS: 500.0000 mg | ORAL_TABLET | Freq: Two times a day (BID) | ORAL | Status: DC

## 2013-11-05 NOTE — ED Provider Notes (Signed)
CSN: [id number]     Arrival date & time 11/04/13  2329 History   First MD Initiated Contact with Patient 11/05/13 [phone]     Chief Complaint  Patient presents with  . Fall     (Consider location/radiation/quality/duration/timing/severity/associated sxs/prior Treatment) HPI Comments: 23 year old male presents with complaint of chest pain after falling and striking his chest on the ground. He states that he tripped and fell several hours ago. He denies hitting his head, denies any other injuries, he states that he tried to catch himself with his arms but states "it didn't turn out like a wanted it to"  Symptoms are persistent, it does not make it worse to take a deep breath but does hurt worse to palpate the chest  Patient is a 23 y.o. male presenting with fall. The history is provided by the patient.  Fall    Past Medical History  Diagnosis Date  . Asthma   . Hypertension 08/2013  . 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 Anterior cruciate ligament repair      R Knee   No family history on file. History  Substance Use Topics  . Smoking status: Current Every Day Smoker -- 0.50 packs/day    Types: Cigarettes  . Smokeless tobacco: Not on file  . Alcohol Use: Yes     Comment: 1/2 gallon every 2 weeks    Review of Systems  All other systems reviewed and are negative.      Allergies  Review of patient's allergies indicates no known allergies.  Home Medications   Current Outpatient Rx  Name  Route  Sig  Dispense  Refill  . doxepin (SINEQUAN) 25 MG capsule   Oral   Take 25 mg by mouth at bedtime.         . fluPHENAZine (PROLIXIN) 2.5 MG tablet   Oral   Take 2.5 mg by mouth daily.         Marland Kitchen. ibuprofen (ADVIL,MOTRIN) 800 MG tablet   Oral   Take 1 tablet (800 mg total) by mouth 3 (three) times daily.   21 tablet   0   . naproxen (NAPROSYN) 500 MG tablet   Oral   Take 1 tablet (500 mg total) by mouth 2 (two) times daily with  a meal.   30 tablet   0   . ondansetron (ZOFRAN) 4 MG tablet   Oral   Take 1 tablet (4 mg total) by mouth every 6 (six) hours.   12 tablet   0   . pantoprazole (PROTONIX) 20 MG tablet   Oral   Take 1 tablet (20 mg total) by mouth daily.   14 tablet   0    BP 145/82  Pulse 96  Temp(Src) 99.4 F (37.4 C) (Oral)  Resp 16  Ht 5\' 11"  (1.803 m)  Wt 300 lb (136.079 kg)  BMI 41.86 kg/m2  SpO2 96% Physical Exam  Nursing note and vitals reviewed. Constitutional: He appears well-developed and well-nourished. No distress.  HENT:  Head: Normocephalic and atraumatic.  Mouth/Throat: Oropharynx is clear and moist. No oropharyngeal exudate.  Eyes: Conjunctivae and EOM are normal. Pupils are equal, round, and reactive to light. Right eye exhibits no discharge. Left eye exhibits no discharge. No scleral icterus.  Neck: Normal range of motion. Neck supple. No JVD present. No thyromegaly present.  Cardiovascular: Normal rate, regular rhythm, normal heart sounds and intact distal pulses.  Exam reveals no gallop and no friction  rub.   No murmur heard. Pulmonary/Chest: Effort normal and breath sounds normal. No respiratory distress. He has no wheezes. He has no rales. He exhibits tenderness ( Minimal sternal tenderness, no crepitance or subcutaneous emphysema).  No pain with deep breathing  Abdominal: Soft. Bowel sounds are normal. He exhibits no distension and no mass. There is no tenderness.  No abdominal tenderness  Musculoskeletal: Normal range of motion. He exhibits no edema and no tenderness.  Joints are supple, compartments are soft  Lymphadenopathy:    He has no cervical adenopathy.  Neurological: He is alert. Coordination normal.  Skin: Skin is warm and dry. No rash noted. No erythema.  Psychiatric: He has a normal mood and affect. His behavior is normal.    ED Course  Procedures (including critical care time) Labs Review Labs Reviewed - No data to display Imaging Review No  results found.   EKG Interpretation None      MDM   Final diagnoses:  Contusion of chest    The patient is benign in appearance, normal vital signs, no respiratory symptoms, no pain with breathing, stable for discharge   Meds given in ED:  Medications  naproxen (NAPROSYN) tablet 500 mg (not administered)    New Prescriptions   NAPROXEN (NAPROSYN) 500 MG TABLET    Take 1 tablet (500 mg total) by mouth 2 (two) times daily with a meal.        Vida Roller, MD 11/05/13 [phone]

## 2013-11-05 NOTE — Discharge Instructions (Signed)
Naprosyn for pain twice daily

## 2013-12-07 ENCOUNTER — Emergency Department (HOSPITAL_COMMUNITY)
Admission: EM | Admit: 2013-12-07 | Discharge: 2013-12-07 | Disposition: A | Discharge status: Home / Self Care | Payer: Medicaid Other | Attending: Emergency Medicine | Admitting: Emergency Medicine

## 2013-12-07 ENCOUNTER — Encounter (HOSPITAL_COMMUNITY): Payer: Self-pay | Admitting: Emergency Medicine

## 2013-12-07 DIAGNOSIS — R079 Chest pain, unspecified: Secondary | ICD-10-CM

## 2013-12-07 DIAGNOSIS — Z79899 Other long term (current) drug therapy: Secondary | ICD-10-CM | POA: Insufficient documentation

## 2013-12-07 DIAGNOSIS — J45909 Unspecified asthma, uncomplicated: Secondary | ICD-10-CM | POA: Insufficient documentation

## 2013-12-07 DIAGNOSIS — F17200 Nicotine dependence, unspecified, uncomplicated: Secondary | ICD-10-CM | POA: Insufficient documentation

## 2013-12-07 DIAGNOSIS — I10 Essential (primary) hypertension: Secondary | ICD-10-CM | POA: Insufficient documentation

## 2013-12-07 DIAGNOSIS — Z791 Long term (current) use of non-steroidal anti-inflammatories (NSAID): Secondary | ICD-10-CM | POA: Insufficient documentation

## 2013-12-07 LAB — CBC WITH DIFFERENTIAL/PLATELET
Basophils Absolute: 0 10*3/uL (ref 0.0–0.1)
Basophils Relative: 0 % (ref 0–1)
Eosinophils Absolute: 0.2 10*3/uL (ref 0.0–0.7)
Eosinophils Relative: 2 % (ref 0–5)
HCT: 45.8 % (ref 39.0–52.0)
Hemoglobin: 15.6 g/dL (ref 13.0–17.0)
Lymphocytes Relative: 40 % (ref 12–46)
Lymphs Abs: 2.7 10*3/uL (ref 0.7–4.0)
MCH: 27 pg (ref 26.0–34.0)
MCHC: 34.1 g/dL (ref 30.0–36.0)
MCV: 79.2 fL (ref 78.0–100.0)
Monocytes Absolute: 0.7 10*3/uL (ref 0.1–1.0)
Monocytes Relative: 10 % (ref 3–12)
Neutro Abs: 3.2 10*3/uL (ref 1.7–7.7)
Neutrophils Relative %: 48 % (ref 43–77)
Platelets: 260 10*3/uL (ref 150–400)
RBC: 5.78 MIL/uL (ref 4.22–5.81)
RDW: 13.9 % (ref 11.5–15.5)
WBC: 6.7 10*3/uL (ref 4.0–10.5)

## 2013-12-07 LAB — D-DIMER, QUANTITATIVE: D-Dimer, Quant: <0.27 ug/mL-FEU (ref 0.00–0.48)

## 2013-12-07 LAB — BASIC METABOLIC PANEL
BUN: 13 mg/dL (ref 6–23)
CO2: 23 mEq/L (ref 19–32)
Calcium: 10 mg/dL (ref 8.4–10.5)
Chloride: 100 mEq/L (ref 96–112)
Creatinine, Ser: 0.82 mg/dL (ref 0.50–1.35)
GFR calc Af Amer: >90 mL/min (ref >90)
GFR calc non Af Amer: >90 mL/min (ref >90)
Glucose, Bld: 89 mg/dL (ref 70–99)
Potassium: 4.3 mEq/L (ref 3.7–5.3)
Sodium: 136 mEq/L — ABNORMAL LOW (ref 137–147)

## 2013-12-07 NOTE — Discharge Instructions (Signed)
Chest Pain (Nonspecific) °It is often hard to give a specific diagnosis for the cause of chest pain. There is always a chance that your pain could be related to something serious, such as a heart attack or a blood clot in the lungs. You need to follow up with your caregiver for further evaluation. °CAUSES  °· Heartburn. °· Pneumonia or bronchitis. °· Anxiety or stress. °· Inflammation around your heart (pericarditis) or lung (pleuritis or pleurisy). °· A blood clot in the lung. °· A collapsed lung (pneumothorax). It can develop suddenly on its own (spontaneous pneumothorax) or from injury (trauma) to the chest. °· Shingles infection (herpes zoster virus). °The chest wall is composed of bones, muscles, and cartilage. Any of these can be the source of the pain. °· The bones can be bruised by injury. °· The muscles or cartilage can be strained by coughing or overwork. °· The cartilage can be affected by inflammation and become sore (costochondritis). °DIAGNOSIS  °Lab tests or other studies, such as X-rays, electrocardiography, stress testing, or cardiac imaging, may be needed to find the cause of your pain.  °TREATMENT  °· Treatment depends on what may be causing your chest pain. Treatment may include: °· Acid blockers for heartburn. °· Anti-inflammatory medicine. °· Pain medicine for inflammatory conditions. °· Antibiotics if an infection is present. °· You may be advised to change lifestyle habits. This includes stopping smoking and avoiding alcohol, caffeine, and chocolate. °· You may be advised to keep your head raised (elevated) when sleeping. This reduces the chance of acid going backward from your stomach into your esophagus. °· Most of the time, nonspecific chest pain will improve within 2 to 3 days with rest and mild pain medicine. °HOME CARE INSTRUCTIONS  °· If antibiotics were prescribed, take your antibiotics as directed. Finish them even if you start to feel better. °· For the next few days, avoid physical  activities that bring on chest pain. Continue physical activities as directed. °· Do not smoke. °· Avoid drinking alcohol. °· Only take over-the-counter or prescription medicine for pain, discomfort, or fever as directed by your caregiver. °· Follow your caregiver's suggestions for further testing if your chest pain does not go away. °· Keep any follow-up appointments you made. If you do not go to an appointment, you could develop lasting (chronic) problems with pain. If there is any problem keeping an appointment, you must call to reschedule. °SEEK MEDICAL CARE IF:  °· You think you are having problems from the medicine you are taking. Read your medicine instructions carefully. °· Your chest pain does not go away, even after treatment. °· You develop a rash with blisters on your chest. °SEEK IMMEDIATE MEDICAL CARE IF:  °· You have increased chest pain or pain that spreads to your arm, neck, jaw, back, or abdomen. °· You develop shortness of breath, an increasing cough, or you are coughing up blood. °· You have severe back or abdominal pain, feel nauseous, or vomit. °· You develop severe weakness, fainting, or chills. °· You have a fever. °THIS IS AN EMERGENCY. Do not wait to see if the pain will go away. Get medical help at once. Call your local emergency services (911 in [place] Do not drive yourself to the hospital. °MAKE SURE YOU:  °· Understand these instructions. °· Will watch your condition. °· Will get help right away if you are not doing well or get worse. °Document Released: 05/14/2005 Document Revised: 10/27/2011 Document Reviewed: 03/09/2008 °ExitCare® Patient Information ©2014 [employer]   [employer]. ° °

## 2013-12-07 NOTE — ED Notes (Signed)
Attempt x 2 for IV not successful. Pt declined for additional IV requested only for blood draw. Requested Amanda NT to assist for blood draw.

## 2013-12-07 NOTE — ED Provider Notes (Signed)
CSN: [id number]     Arrival date & time 12/07/13  1501 History   First MD Initiated Contact with Patient 12/07/13 1508     Chief Complaint  Patient presents with  . Chest Pain     (Consider location/radiation/quality/duration/timing/severity/associated sxs/prior Treatment) HPI  23 year old male with chest pain. Sharp. Intermittent. No appreciable relieving factors. Symptoms worse with deep breathing. No shortness of breath. No fevers or chills. No cough. No nausea or diaphoresis. Patient says he's been told he has high blood pressure. Otherwise healthy. Has been evaluated recently for the same complaints. No unusual leg pain or swelling. No history of DVT/pulmonary embolism. Denies drug use.  Past Medical History  Diagnosis Date  . Asthma   . Hypertension 08/2013  . 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 Anterior cruciate ligament repair      R Knee   No family history on file. History  Substance Use Topics  . Smoking status: Current Every Day Smoker -- 0.50 packs/day    Types: Cigarettes  . Smokeless tobacco: Not on file  . Alcohol Use: Yes     Comment: 1/2 gallon every 2 weeks    Review of Systems  All systems reviewed and negative, other than as noted in HPI.   Allergies  Review of patient's allergies indicates no known allergies.  Home Medications   Prior to Admission medications   Medication Sig Start Date End Date Taking? Authorizing Provider  doxepin (SINEQUAN) 25 MG capsule Take 25 mg by mouth at bedtime.    Historical Provider, MD  fluPHENAZine (PROLIXIN) 2.5 MG tablet Take 2.5 mg by mouth daily.    Historical Provider, MD  ibuprofen (ADVIL,MOTRIN) 800 MG tablet Take 1 tablet (800 mg total) by mouth 3 (three) times daily. 10/26/13   Roxy Horsemanobert Browning, PA-C  naproxen (NAPROSYN) 500 MG tablet Take 1 tablet (500 mg total) by mouth 2 (two) times daily with a meal. 11/05/13   Vida RollerBrian D Miller, MD  ondansetron (ZOFRAN) 4 MG tablet  Take 1 tablet (4 mg total) by mouth every 6 (six) hours. 10/29/13   Celene KrasJon R Knapp, MD  pantoprazole (PROTONIX) 20 MG tablet Take 1 tablet (20 mg total) by mouth daily. 10/29/13   Celene KrasJon R Knapp, MD   BP 124/85  Temp(Src) 98.4 F (36.9 C) (Oral)  Resp 18  SpO2 100% Physical Exam  Nursing note and vitals reviewed. Constitutional: He appears well-developed and well-nourished. No distress.  Laying in bed. No acute distress. Obese.  HENT:  Head: Normocephalic and atraumatic.  Eyes: Conjunctivae are normal. Right eye exhibits no discharge. Left eye exhibits no discharge.  Neck: Neck supple.  Cardiovascular: Normal rate, regular rhythm and normal heart sounds.  Exam reveals no gallop and no friction rub.   No murmur heard. Pulmonary/Chest: Effort normal and breath sounds normal. No respiratory distress. He exhibits no tenderness.  Chest pain is not reproducible with palpation  Abdominal: Soft. He exhibits no distension. There is no tenderness.  Musculoskeletal: He exhibits no edema and no tenderness.  Lower extremities symmetric as compared to each other. No calf tenderness. Negative Homan's. No palpable cords.   Neurological: He is alert.  Skin: Skin is warm and dry.  Psychiatric: He has a normal mood and affect. His behavior is normal. Thought content normal.    ED Course  Procedures (including critical care time) Labs Review Labs Reviewed  BASIC METABOLIC PANEL - Abnormal; Notable for the following:    Sodium 136 (*)  All other components within normal limits  CBC WITH DIFFERENTIAL  D-DIMER, QUANTITATIVE    Imaging Review No results found.   EKG Interpretation   Date/Time:  Wednesday December 07 2013 15:02:42 EDT Ventricular Rate:  97 PR Interval:  152 QRS Duration: 95 QT Interval:  350 QTC Calculation: 445 R Axis:   47 Text Interpretation:  Sinus rhythm Left atrial enlargement Borderline T  wave abnormalities similar  to EKG from 10/29/13 Confirmed by Rolla Servidio  MD,  Bladimir Auman  ([id number]) on 12/07/2013 3:11:17 PM      MDM   Final diagnoses:  Chest pain    23 year old male with chest pain. Very atypical for ACS. He is in no acute distress on exam. Lungs are clear. HD stable. D-dimer is normal. Doubt dissection. Does not appear to be an infectious etiology. Suspicion for urgent/emergent process. Instructed to take NSAIDs as needed. Return precautions were discussed. Outpatient followup as needed otherwise.   Raeford RazorStephen Lyrah Bradt, MD 12/13/13 [phone]

## 2013-12-07 NOTE — ED Notes (Signed)
Attempted to discharge this pt. Pt not in room

## 2013-12-07 NOTE — ED Notes (Signed)
Bed: [id number] Expected date:  Expected time:  Means of arrival:  Comments: EMS - SOB, pain with inspiration.

## 2013-12-07 NOTE — ED Notes (Addendum)
Pt presents with NAD. Per [facility]. Pt c/o of sudden of genrlized chest pain sharp in nature, non radiating while walking. Worse on movement and breathing. Denies  N/V/D, SOB. Decreased pain with rest. [facility]. Was informed due to not taking BP meds. Pt unsure if he took BP meds today

## 2014-04-10 ENCOUNTER — Emergency Department (HOSPITAL_COMMUNITY): Payer: Medicaid Other

## 2014-04-10 ENCOUNTER — Emergency Department (HOSPITAL_COMMUNITY)
Admission: EM | Admit: 2014-04-10 | Discharge: 2014-04-11 | Disposition: A | Discharge status: Home / Self Care | Payer: Medicaid Other | Attending: Emergency Medicine | Admitting: Emergency Medicine

## 2014-04-10 ENCOUNTER — Encounter (HOSPITAL_COMMUNITY): Payer: Self-pay | Admitting: Emergency Medicine

## 2014-04-10 DIAGNOSIS — S8990XA Unspecified injury of unspecified lower leg, initial encounter: Secondary | ICD-10-CM | POA: Insufficient documentation

## 2014-04-10 DIAGNOSIS — Z79899 Other long term (current) drug therapy: Secondary | ICD-10-CM | POA: Insufficient documentation

## 2014-04-10 DIAGNOSIS — I10 Essential (primary) hypertension: Secondary | ICD-10-CM | POA: Diagnosis not present

## 2014-04-10 DIAGNOSIS — F17200 Nicotine dependence, unspecified, uncomplicated: Secondary | ICD-10-CM | POA: Insufficient documentation

## 2014-04-10 DIAGNOSIS — Y9239 Other specified sports and athletic area as the place of occurrence of the external cause: Secondary | ICD-10-CM | POA: Insufficient documentation

## 2014-04-10 DIAGNOSIS — Y9361 Activity, american tackle football: Secondary | ICD-10-CM | POA: Diagnosis not present

## 2014-04-10 DIAGNOSIS — S99929A Unspecified injury of unspecified foot, initial encounter: Principal | ICD-10-CM

## 2014-04-10 DIAGNOSIS — W219XXA Striking against or struck by unspecified sports equipment, initial encounter: Secondary | ICD-10-CM | POA: Insufficient documentation

## 2014-04-10 DIAGNOSIS — Y92838 Other recreation area as the place of occurrence of the external cause: Secondary | ICD-10-CM

## 2014-04-10 DIAGNOSIS — M25561 Pain in right knee: Secondary | ICD-10-CM

## 2014-04-10 DIAGNOSIS — S99919A Unspecified injury of unspecified ankle, initial encounter: Secondary | ICD-10-CM | POA: Diagnosis present

## 2014-04-10 DIAGNOSIS — J45909 Unspecified asthma, uncomplicated: Secondary | ICD-10-CM | POA: Insufficient documentation

## 2014-04-10 MED ORDER — HYDROCODONE-ACETAMINOPHEN 5-325 MG PO TABS: 1.0000 | ORAL_TABLET | Freq: Four times a day (QID) | ORAL | Status: DC | PRN

## 2014-04-10 MED ORDER — IBUPROFEN 800 MG PO TABS: 800.0000 mg | ORAL_TABLET | Freq: Three times a day (TID) | ORAL | Status: DC

## 2014-04-10 NOTE — Discharge Instructions (Signed)
Call for a follow up appointment with a Family or Primary Care Provider.  Call a Orthopedic for further evaluation of your knee pain. Return if Symptoms worsen.   Take medication as prescribed.  Elevate your knee above your heart 3-4 times a day to reduce swelling.

## 2014-04-10 NOTE — ED Notes (Signed)
Pt in via [facility] to triage c/o pain to right knee after an injury two weeks ago and then again tonight, pt with history of torn ACL to that knee 5 years ago, pt ambulatory, no distress noted

## 2014-04-10 NOTE — ED Provider Notes (Signed)
CSN: [id number]     Arrival date & time 04/10/14  2148 History   First MD Initiated Contact with Patient 04/10/14 2157     This chart was scribed for non-physician practitioner, Mellody Drown PA-C working with Dr. Mora Bellman by Arlan Organ, ED Scribe. This patient was seen in room TR07C/TR07C and the patient's care was started at 11:46 PM.   Chief Complaint  Patient presents with  . Knee Injury   HPI Comments: Benjamin Garcia is a 23 y.o. male who presents to the Emergency Department complaining of R knee pain. Patient reports sustaining an injury to the knee while playing football 2 weeks ago and most recently this evening. This pain is worsened with full extension and walking up and down steps. He has tried 800 mg Ibuprofen with mild improvement of symptoms.  Patient has a PSHx  ACL to R knee 5 years ago. No known allergies to medications. No other concerns this visit. Orthopedic specialist in [place], [place] has not followed up.  The history is provided by the patient. No language interpreter was used.    Past Medical History  Diagnosis Date  . Asthma   . Hypertension 08/2013  . 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 Anterior cruciate ligament repair      R Knee   History reviewed. No pertinent family history. History  Substance Use Topics  . Smoking status: Current Every Day Smoker -- 0.50 packs/day    Types: Cigarettes  . Smokeless tobacco: Not on file  . Alcohol Use: Yes     Comment: 1/2 gallon every 2 weeks    Review of Systems  Constitutional: Negative for fever and chills.  Musculoskeletal: Positive for arthralgias and gait problem.  Skin: Negative for color change and wound.  Neurological: Negative for weakness and numbness.  All other systems reviewed and are negative.     Allergies  Review of patient's allergies indicates no known allergies.  Home Medications   Prior to Admission medications   Medication Sig Start Date End Date  Taking? Authorizing Provider  doxepin (SINEQUAN) 25 MG capsule Take 25 mg by mouth at bedtime.    Historical Provider, MD  lisinopril (PRINIVIL,ZESTRIL) 10 MG tablet Take 10 mg by mouth daily.    Historical Provider, MD  omeprazole (PRILOSEC) 20 MG capsule Take 20 mg by mouth daily.    Historical Provider, MD   Triage Vitals: BP 134/90  Pulse 85  Temp(Src) 98.3 F (36.8 C) (Oral)  Resp 18  SpO2 98%   Physical Exam  Nursing note and vitals reviewed. Constitutional: He is oriented to person, place, and time. He appears well-developed and well-nourished. No distress.  HENT:  Head: Normocephalic and atraumatic.  Neck: Neck supple.  Pulmonary/Chest: Effort normal. No respiratory distress.  Musculoskeletal:       Right knee: He exhibits swelling. He exhibits normal range of motion, no effusion, no ecchymosis, no deformity, no laceration and no erythema. Tenderness found.  Mild swelling.  Mild generalized tenderness. Able to bear weight in the ED with discomfort. Normal sensation, good strength.  No increase in warmth or erythema.  Neurological: He is oriented to person, place, and time.  Skin: Skin is warm and dry. He is not diaphoretic.  Psychiatric: He has a normal mood and affect. His behavior is normal.    ED Course  Procedures (including critical care time)  DIAGNOSTIC STUDIES:  COORDINATION OF CARE: 11:46 PM-Discussed treatment plan with pt at bedside and  pt agreed to plan.     Labs Review Labs Reviewed - No data to display  Imaging Review Dg Knee Complete 4 Views Right  04/10/2014   CLINICAL DATA:  Knee pain, status post ACL repair.  EXAM: RIGHT KNEE - COMPLETE 4+ VIEW  COMPARISON:  Right knee radiographs February 01, 2013  FINDINGS: There is no evidence of fracture, dislocation, or joint effusion. Clip lateral to the femoral epicondyle consistent with prior knee surgery. Re- demonstration of sub cm bony fragments appearing intra-articular. Mild tricompartmental compartment  osteoarthrosis. No destructive bony lesions. Soft tissues are unremarkable.  IMPRESSION: Status post remote knee surgery, similar loose bodies are likely intra-articular.  Mild tricompartmental osteoarthrosis. No acute fracture deformity or dislocation.   Electronically Signed   By: Awilda Metro   On: 04/10/2014 22:37     EKG Interpretation None      MDM   Final diagnoses:  Knee pain, acute, right   Patient presents with right knee pain, history of anterior cruciate ligament reconstruction. Reports recent fall today. ambulates with discomfort. X-ray negative for acute findings, knee sleeve, rice, crutches, ortho followup given. Meds given in ED:  Medications - No data to display  New Prescriptions   HYDROCODONE-ACETAMINOPHEN (NORCO/VICODIN) 5-325 MG PER TABLET    Take 1 tablet by mouth every 6 (six) hours as needed for moderate pain or severe pain.   IBUPROFEN (ADVIL,MOTRIN) 800 MG TABLET    Take 1 tablet (800 mg total) by mouth 3 (three) times daily after meals.   I personally performed the services described in this documentation, which was scribed in my presence. The recorded information has been reviewed and is accurate.    Mellody Drown, PA-C 04/11/14 0003

## 2014-04-11 NOTE — ED Notes (Signed)
Declined W/C at D/C and was escorted to lobby by RN. 

## 2014-04-11 NOTE — ED Provider Notes (Signed)
Medical screening examination/treatment/procedure(s) were performed by non-physician practitioner and as supervising physician I was immediately available for consultation/collaboration.   EKG Interpretation None        Tomasita Crumble, MD 04/11/14 [phone]

## 2014-12-10 DIAGNOSIS — S39012A Strain of muscle, fascia and tendon of lower back, initial encounter: Secondary | ICD-10-CM | POA: Insufficient documentation

## 2014-12-10 DIAGNOSIS — J45909 Unspecified asthma, uncomplicated: Secondary | ICD-10-CM | POA: Diagnosis not present

## 2014-12-10 DIAGNOSIS — Y929 Unspecified place or not applicable: Secondary | ICD-10-CM | POA: Insufficient documentation

## 2014-12-10 DIAGNOSIS — Z720 Tobacco use: Secondary | ICD-10-CM | POA: Diagnosis not present

## 2014-12-10 DIAGNOSIS — Y939 Activity, unspecified: Secondary | ICD-10-CM | POA: Insufficient documentation

## 2014-12-10 DIAGNOSIS — Y999 Unspecified external cause status: Secondary | ICD-10-CM | POA: Diagnosis not present

## 2014-12-10 DIAGNOSIS — X58XXXA Exposure to other specified factors, initial encounter: Secondary | ICD-10-CM | POA: Diagnosis not present

## 2014-12-10 DIAGNOSIS — I10 Essential (primary) hypertension: Secondary | ICD-10-CM | POA: Insufficient documentation

## 2014-12-10 DIAGNOSIS — S3992XA Unspecified injury of lower back, initial encounter: Secondary | ICD-10-CM | POA: Diagnosis present

## 2014-12-11 ENCOUNTER — Emergency Department (HOSPITAL_COMMUNITY)
Admission: EM | Admit: 2014-12-11 | Discharge: 2014-12-11 | Disposition: A | Discharge status: Home / Self Care | Payer: Medicaid Other | Attending: Emergency Medicine | Admitting: Emergency Medicine

## 2014-12-11 ENCOUNTER — Encounter (HOSPITAL_COMMUNITY): Payer: Self-pay | Admitting: *Deleted

## 2014-12-11 DIAGNOSIS — S39012A Strain of muscle, fascia and tendon of lower back, initial encounter: Secondary | ICD-10-CM

## 2014-12-11 HISTORY — DX: Sprain of anterior cruciate ligament of unspecified knee, initial encounter: S83.519A

## 2014-12-11 LAB — I-STAT CHEM 8, ED
BUN: 15 mg/dL (ref 6–23)
Calcium, Ion: 1.2 mmol/L (ref 1.12–1.23)
Chloride: 104 mmol/L (ref 96–112)
Creatinine, Ser: 0.9 mg/dL (ref 0.50–1.35)
GLUCOSE: 106 mg/dL — AB (ref 70–99)
HCT: 47 % (ref 39.0–52.0)
HEMOGLOBIN: 16 g/dL (ref 13.0–17.0)
Potassium: 3.6 mmol/L (ref 3.5–5.1)
Sodium: 141 mmol/L (ref 135–145)
TCO2: 21 mmol/L (ref 0–100)

## 2014-12-11 LAB — CBC
HCT: 43.3 % (ref 39.0–52.0)
HEMOGLOBIN: 14.6 g/dL (ref 13.0–17.0)
MCH: 25.9 pg — AB (ref 26.0–34.0)
MCHC: 33.7 g/dL (ref 30.0–36.0)
MCV: 76.8 fL — ABNORMAL LOW (ref 78.0–100.0)
PLATELETS: 212 10*3/uL (ref 150–400)
RBC: 5.64 MIL/uL (ref 4.22–5.81)
RDW: 14.9 % (ref 11.5–15.5)
WBC: 8.2 10*3/uL (ref 4.0–10.5)

## 2014-12-11 MED ORDER — METHOCARBAMOL 500 MG PO TABS
1000.0000 mg | ORAL_TABLET | Freq: Once | ORAL | Status: AC
  Administered 2014-12-11: 1000 mg via ORAL
  Filled 2014-12-11: qty 2

## 2014-12-11 MED ORDER — METHOCARBAMOL 500 MG PO TABS
1000.0000 mg | ORAL_TABLET | Freq: Three times a day (TID) | ORAL | Status: DC | PRN
Start: 2014-12-11 — End: 2022-10-14

## 2014-12-11 MED ORDER — KETOROLAC TROMETHAMINE 60 MG/2ML IM SOLN
60.0000 mg | Freq: Once | INTRAMUSCULAR | Status: AC
  Administered 2014-12-11: 60 mg via INTRAMUSCULAR
  Filled 2014-12-11: qty 2

## 2014-12-11 NOTE — ED Provider Notes (Signed)
CSN: [id number]     Arrival date & time 12/10/14  2358 History   First MD Initiated Contact with Patient 12/11/14 0033     This chart was scribed for Loren Racer, MD by Arlan Organ, ED Scribe. This patient was seen in room A13C/A13C and the patient's care was started 1:00 AM.   Chief Complaint  Patient presents with  . Back Pain   HPI  HPI Comments: Benjamin Garcia is a 24 y.o. male with a PMHx of asthma and HTN who presents to the Emergency Department complaining of constant, ongoing, unchanged lower back pain x 1.5 weeks. Pt admits to more frequent walking in last few weeks but no recent trauma or injury. Pain is exacerbated with movement and alleviated at rest. Benjamin Garcia has tried 3 doses of 800 mg Ibuprofen without any improvement for symptoms. Last dose taken earlier today. No recent fever or chills. He denies any urinary symptoms. No bowel or urinary incontinence. No known allergies to medications. No focal weakness or numbness  Past Medical History  Diagnosis Date  . Asthma   . Hypertension 08/2013  . 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 ACL injury tear    Past Surgical History  Procedure Laterality Date  . Anterior cruciate ligament repair      R Knee   No family history on file. History  Substance Use Topics  . Smoking status: Current Every Day Smoker -- 0.50 packs/day    Types: Cigarettes  . Smokeless tobacco: Not on file  . Alcohol Use: Yes     Comment: 1/2 gallon every 2 weeks - states he hasnt drank in 2012    Review of Systems  Constitutional: Negative for fever and chills.  Genitourinary: Negative for dysuria, hematuria and difficulty urinating.  Musculoskeletal: Positive for myalgias and back pain. Negative for neck pain and neck stiffness.  Skin: Negative for rash.  Neurological: Negative for weakness and numbness.  Psychiatric/Behavioral: Negative for confusion.  All other systems reviewed and are negative.     Allergies  Review of patient's allergies indicates  no known allergies.  Home Medications   Prior to Admission medications   Medication Sig Start Date End Date Taking? Authorizing Provider  HYDROcodone-acetaminophen (NORCO/VICODIN) 5-325 MG per tablet Take 1 tablet by mouth every 6 (six) hours as needed for moderate pain or severe pain. Patient not taking: Reported on 12/11/2014 04/10/14   Mellody Drown, PA-C  ibuprofen (ADVIL,MOTRIN) 800 MG tablet Take 1 tablet (800 mg total) by mouth 3 (three) times daily after meals. Patient not taking: Reported on 12/11/2014 04/10/14   Mellody Drown, PA-C   Triage Vitals: BP 131/64 mmHg  Pulse 59  Temp(Src) 97.7 F (36.5 C) (Oral)  Resp 18  Ht  (1.778 m)  Wt 300 lb (136.079 kg)  BMI 43.05 kg/m2  SpO2 94%   Physical Exam  Constitutional: He is oriented to person, place, and time. He appears well-developed and well-nourished. No distress.  HENT:  Head: Normocephalic and atraumatic.  Mouth/Throat: Oropharynx is clear and moist.  Eyes: EOM are normal. Pupils are equal, round, and reactive to light.  Neck: Normal range of motion. Neck supple.  Cardiovascular: Normal rate and regular rhythm.   Pulmonary/Chest: Effort normal and breath sounds normal. No respiratory distress. He has no wheezes. He has no rales.  Abdominal: Soft. Bowel sounds are normal.  Musculoskeletal: Normal range of motion. He exhibits tenderness (tenderness to palpation in the bilateral paraspinal lumbar region. No midline tenderness. Negative straight leg raise. Distal  pulses intact.). He exhibits no edema.  Neurological: He is alert and oriented to person, place, and time.  Patient is alert and oriented x3 with clear, goal oriented speech. Patient has 5/5 motor in all extremities. Sensation is intact to light touch. Bilateral finger-to-nose is normal with no signs of dysmetria. Patient has a normal gait and walks without assistance.  Skin: Skin is warm and dry. No rash noted. No erythema.  Psychiatric: He has a normal mood and  affect. His behavior is normal.  Nursing note and vitals reviewed.   ED Course  Procedures (including critical care time)  DIAGNOSTIC STUDIES: Oxygen Saturation is 95% on RA, adequate by my interpretation.    COORDINATION OF CARE: 1:02 AM-Discussed treatment plan with pt at bedside and pt agreed to plan.     Labs Review Labs Reviewed  CBC - Abnormal; Notable for the following:    MCV 76.8 (*)    MCH 25.9 (*)    All other components within normal limits  I-STAT CHEM 8, ED - Abnormal; Notable for the following:    Glucose, Bld 106 (*)    All other components within normal limits    Imaging Review No results found.   EKG Interpretation None      MDM   Final diagnoses:  Lumbar strain, initial encounter    I personally performed the services described in this documentation, which was scribed in my presence. The recorded information has been reviewed and is accurate.  With a normal neurologic exam. Tender to palpation in the lumbar region. No history of trauma. Low suspicion for cauda equina, epidural abscess or other emergent diagnoses. We'll treat symptomatically. Return precautions given.     Loren Raceravid Ramla Hase, MD 12/11/14 [phone]

## 2014-12-11 NOTE — ED Notes (Signed)
Pt arrives via EMS from home, c/o back pain that started last Friday 12/01/14. States that the pain has been worsening since last Friday and is now 10/10. Denies trauma to back.

## 2014-12-11 NOTE — ED Notes (Addendum)
Pt states lower, mid back pain x 1.5 weeks. Denies any injury or new activity that could have  Brought on pain, also denies any urinary symptoms. Reports that pain does run down his right leg, some numbness present in leg. Pt ambulated to room, tolerated well.

## 2014-12-11 NOTE — ED Notes (Signed)
Pt also c/o weakness all day today.

## 2014-12-11 NOTE — Discharge Instructions (Signed)

## 2015-01-15 ENCOUNTER — Emergency Department (HOSPITAL_COMMUNITY): Payer: Medicaid Other

## 2015-01-15 ENCOUNTER — Emergency Department (HOSPITAL_COMMUNITY)
Admission: EM | Admit: 2015-01-15 | Discharge: 2015-01-15 | Disposition: A | Discharge status: Home / Self Care | Payer: Medicaid Other | Attending: Emergency Medicine | Admitting: Emergency Medicine

## 2015-01-15 ENCOUNTER — Encounter (HOSPITAL_COMMUNITY): Payer: Self-pay | Admitting: *Deleted

## 2015-01-15 DIAGNOSIS — M545 Low back pain: Secondary | ICD-10-CM | POA: Insufficient documentation

## 2015-01-15 DIAGNOSIS — R52 Pain, unspecified: Secondary | ICD-10-CM

## 2015-01-15 DIAGNOSIS — R519 Headache, unspecified: Secondary | ICD-10-CM

## 2015-01-15 DIAGNOSIS — R51 Headache: Secondary | ICD-10-CM | POA: Insufficient documentation

## 2015-01-15 DIAGNOSIS — Z79899 Other long term (current) drug therapy: Secondary | ICD-10-CM | POA: Insufficient documentation

## 2015-01-15 DIAGNOSIS — M546 Pain in thoracic spine: Secondary | ICD-10-CM | POA: Diagnosis not present

## 2015-01-15 DIAGNOSIS — I10 Essential (primary) hypertension: Secondary | ICD-10-CM | POA: Diagnosis not present

## 2015-01-15 DIAGNOSIS — J45909 Unspecified asthma, uncomplicated: Secondary | ICD-10-CM | POA: Insufficient documentation

## 2015-01-15 DIAGNOSIS — R079 Chest pain, unspecified: Secondary | ICD-10-CM | POA: Diagnosis present

## 2015-01-15 DIAGNOSIS — Z720 Tobacco use: Secondary | ICD-10-CM | POA: Diagnosis not present

## 2015-01-15 LAB — I-STAT CHEM 8, ED
BUN: 12 mg/dL (ref 6–20)
CALCIUM ION: 1.28 mmol/L — AB (ref 1.12–1.23)
CHLORIDE: 104 mmol/L (ref 101–111)
Creatinine, Ser: 0.9 mg/dL (ref 0.61–1.24)
Glucose, Bld: 88 mg/dL (ref 65–99)
HCT: 47 % (ref 39.0–52.0)
Hemoglobin: 16 g/dL (ref 13.0–17.0)
Potassium: 4.4 mmol/L (ref 3.5–5.1)
Sodium: 139 mmol/L (ref 135–145)
TCO2: 23 mmol/L (ref 0–100)

## 2015-01-15 LAB — CBC
HCT: 45.8 % (ref 39.0–52.0)
HEMOGLOBIN: 15.5 g/dL (ref 13.0–17.0)
MCH: 26.1 pg (ref 26.0–34.0)
MCHC: 33.8 g/dL (ref 30.0–36.0)
MCV: 77 fL — ABNORMAL LOW (ref 78.0–100.0)
PLATELETS: 231 10*3/uL (ref 150–400)
RBC: 5.95 MIL/uL — ABNORMAL HIGH (ref 4.22–5.81)
RDW: 15.4 % (ref 11.5–15.5)
WBC: 5.8 10*3/uL (ref 4.0–10.5)

## 2015-01-15 LAB — I-STAT TROPONIN, ED: Troponin i, poc: 0 ng/mL (ref 0.00–0.08)

## 2015-01-15 MED ORDER — CYCLOBENZAPRINE HCL 10 MG PO TABS: 10.0000 mg | ORAL_TABLET | Freq: Every day | ORAL | Status: DC

## 2015-01-15 MED ORDER — KETOROLAC TROMETHAMINE 30 MG/ML IJ SOLN
30.0000 mg | Freq: Once | INTRAMUSCULAR | Status: AC
  Administered 2015-01-15: 30 mg via INTRAVENOUS
  Filled 2015-01-15: qty 1

## 2015-01-15 MED ORDER — IBUPROFEN 800 MG PO TABS
800.0000 mg | ORAL_TABLET | Freq: Three times a day (TID) | ORAL | Status: DC | PRN
Start: 2015-01-15 — End: 2022-10-14

## 2015-01-15 MED ORDER — HYDROCODONE-ACETAMINOPHEN 5-325 MG PO TABS: 1.0000 | ORAL_TABLET | Freq: Four times a day (QID) | ORAL | Status: DC | PRN

## 2015-01-15 MED ORDER — SODIUM CHLORIDE 0.9 % IV BOLUS (SEPSIS)
1000.0000 mL | Freq: Once | INTRAVENOUS | Status: AC
  Administered 2015-01-15: 1000 mL via INTRAVENOUS

## 2015-01-15 NOTE — ED Notes (Signed)
Pt in c/o chest pain and back pain that is constant all day, pt states every day, unable to give specific time frame when asked how long symptoms have been going on, states he thinks they took too much plasma when he donated last and it has made him feel weak. No distress noted

## 2015-01-15 NOTE — ED Provider Notes (Signed)
CSN: [id number]     Arrival date & time 01/15/15  1418 History  This chart was scribed for non-physician practitioner, Ebbie Ridge, PA-C, working with Pricilla Loveless, MD, by Ronney Lion, ED Scribe. This patient was seen in room TR03C/TR03C and the patient's care was started at 3:07 PM.   Chief Complaint  Patient presents with  . Chest Pain   The history is provided by the patient. No language interpreter was used.    HPI Comments: Benjamin Garcia is a 24 y.o. male who presents to the Emergency Department complaining of lower back pain radiating up to his head. Patient has been giving plasma frequently recently and worries they ay be related to his symptoms - he states he gave plasma 8 months last year and 2 months this year. He last gave plasma last week. Patient was given muscle relaxers when he was last seen for the same, with no relief. Deep inspiration exacerbates the pain.   He also complains of having "really bad" headaches recently. He also complains of associated double vision.  Past Medical History  Diagnosis Date  . Asthma   . Hypertension 08/2013  . 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 ACL injury tear    Past Surgical History  Procedure Laterality Date  . Anterior cruciate ligament repair      R Knee   History reviewed. No pertinent family history. History  Substance Use Topics  . Smoking status: Current Every Day Smoker -- 0.50 packs/day    Types: Cigarettes  . Smokeless tobacco: Not on file  . Alcohol Use: Yes     Comment: 1/2 gallon every 2 weeks - states he hasnt drank in 2012    Review of Systems   A complete 10 system review of systems was obtained and all systems are negative except as noted in the HPI and PMH.    Allergies  Review of patient's allergies indicates no known allergies.  Home Medications   Prior to Admission medications   Medication Sig Start Date End Date Taking? Authorizing Provider  HYDROcodone-acetaminophen (NORCO/VICODIN) 5-325 MG per tablet Take 1  tablet by mouth every 6 (six) hours as needed for moderate pain or severe pain. Patient not taking: Reported on 12/11/2014 04/10/14   Mellody Drown, PA-C  ibuprofen (ADVIL,MOTRIN) 800 MG tablet Take 1 tablet (800 mg total) by mouth 3 (three) times daily after meals. Patient not taking: Reported on 12/11/2014 04/10/14   Mellody Drown, PA-C  methocarbamol (ROBAXIN) 500 MG tablet Take 2 tablets (1,000 mg total) by mouth every 8 (eight) hours as needed for muscle spasms. 12/11/14   Loren Racer, MD   BP 137/75 mmHg  Pulse 66  Temp(Src) 98.5 F (36.9 C) (Oral)  Resp 20  SpO2 100% Physical Exam  Constitutional: He is oriented to person, place, and time. He appears well-developed and well-nourished. No distress.  HENT:  Head: Normocephalic and atraumatic.  Eyes: Conjunctivae and EOM are normal.  Neck: Neck supple. No tracheal deviation present.  Cardiovascular: Normal rate.   Pulmonary/Chest: Effort normal. No respiratory distress. He exhibits tenderness.  Anterior bilateral chest wall tenderness to palpation.  Musculoskeletal: Normal range of motion. He exhibits tenderness.  TTP to bilateral upper and lower back.  Neurological: He is alert and oriented to person, place, and time.  Skin: Skin is warm and dry.  Psychiatric: He has a normal mood and affect. His behavior is normal.  Nursing note and vitals reviewed.   ED Course  Procedures (including critical care time)  DIAGNOSTIC  STUDIES: Oxygen Saturation is 100% on RA, normal by my interpretation.    COORDINATION OF CARE: 3:08 PM - Discussed treatment plan with pt at bedside, and pt agreed to plan.   Labs Review Labs Reviewed  CBC  I-STAT TROPOININ, ED  I-STAT CHEM 8, ED    Imaging Review Dg Chest 2 View  01/15/2015   CLINICAL DATA:  Chest pain for 1 day.  EXAM: CHEST  2 VIEW  COMPARISON:  PA and lateral chest 10/26/2013.  FINDINGS: Heart size and mediastinal contours are within normal limits. Both lungs are clear. Visualized  skeletal structures are unremarkable.  IMPRESSION: Negative exam.   Electronically Signed   By: Drusilla Kannerhomas  Dalessio M.D.   On: 01/15/2015 14:45   I personally performed the services described in this documentation, which was scribed in my presence. The recorded information has been reviewed and is accurate.     Charlestine NightChristopher Raaga Maeder, PA-C 01/23/15 1514  Pricilla LovelessScott Goldston, MD 01/27/15 [phone]

## 2015-01-15 NOTE — Discharge Instructions (Signed)
Return here as needed.  Follow-up with your primary care doctor °

## 2015-01-15 NOTE — ED Notes (Signed)
Pt aware that he must wait until 1806 to leave, because he receive IV pain medication

## 2015-03-17 ENCOUNTER — Emergency Department (HOSPITAL_COMMUNITY): Payer: Medicaid Other

## 2015-03-17 ENCOUNTER — Encounter (HOSPITAL_COMMUNITY): Payer: Self-pay

## 2015-03-17 ENCOUNTER — Emergency Department (HOSPITAL_COMMUNITY)
Admission: EM | Admit: 2015-03-17 | Discharge: 2015-03-17 | Disposition: A | Discharge status: Home / Self Care | Payer: Medicaid Other | Attending: Emergency Medicine | Admitting: Emergency Medicine

## 2015-03-17 DIAGNOSIS — Z23 Encounter for immunization: Secondary | ICD-10-CM | POA: Insufficient documentation

## 2015-03-17 DIAGNOSIS — S00212A Abrasion of left eyelid and periocular area, initial encounter: Secondary | ICD-10-CM | POA: Insufficient documentation

## 2015-03-17 DIAGNOSIS — S0081XA Abrasion of other part of head, initial encounter: Secondary | ICD-10-CM | POA: Diagnosis not present

## 2015-03-17 DIAGNOSIS — W19XXXA Unspecified fall, initial encounter: Secondary | ICD-10-CM

## 2015-03-17 DIAGNOSIS — Y998 Other external cause status: Secondary | ICD-10-CM | POA: Insufficient documentation

## 2015-03-17 DIAGNOSIS — T148 Other injury of unspecified body region: Secondary | ICD-10-CM | POA: Diagnosis not present

## 2015-03-17 DIAGNOSIS — J45909 Unspecified asthma, uncomplicated: Secondary | ICD-10-CM | POA: Diagnosis not present

## 2015-03-17 DIAGNOSIS — I10 Essential (primary) hypertension: Secondary | ICD-10-CM | POA: Diagnosis not present

## 2015-03-17 DIAGNOSIS — S29001A Unspecified injury of muscle and tendon of front wall of thorax, initial encounter: Secondary | ICD-10-CM | POA: Insufficient documentation

## 2015-03-17 DIAGNOSIS — S0993XA Unspecified injury of face, initial encounter: Secondary | ICD-10-CM | POA: Diagnosis present

## 2015-03-17 DIAGNOSIS — Y9302 Activity, running: Secondary | ICD-10-CM | POA: Diagnosis not present

## 2015-03-17 DIAGNOSIS — W1842XA Slipping, tripping and stumbling without falling due to stepping into hole or opening, initial encounter: Secondary | ICD-10-CM | POA: Insufficient documentation

## 2015-03-17 DIAGNOSIS — Y92410 Unspecified street and highway as the place of occurrence of the external cause: Secondary | ICD-10-CM | POA: Diagnosis not present

## 2015-03-17 DIAGNOSIS — T148XXA Other injury of unspecified body region, initial encounter: Secondary | ICD-10-CM

## 2015-03-17 DIAGNOSIS — Z720 Tobacco use: Secondary | ICD-10-CM | POA: Diagnosis not present

## 2015-03-17 MED ORDER — TETANUS-DIPHTH-ACELL PERTUSSIS 5-2.5-18.5 LF-MCG/0.5 IM SUSP
0.5000 mL | Freq: Once | INTRAMUSCULAR | Status: AC
  Administered 2015-03-17: 0.5 mL via INTRAMUSCULAR
  Filled 2015-03-17: qty 0.5

## 2015-03-17 MED ORDER — OXYCODONE-ACETAMINOPHEN 5-325 MG PO TABS
1.0000 | ORAL_TABLET | Freq: Once | ORAL | Status: AC
Start: 2015-03-17 — End: 2015-03-17
  Administered 2015-03-17: 1 via ORAL
  Filled 2015-03-17: qty 1

## 2015-03-17 MED ORDER — OXYCODONE-ACETAMINOPHEN 5-325 MG PO TABS: 1.0000 | ORAL_TABLET | Freq: Four times a day (QID) | ORAL | Status: DC | PRN

## 2015-03-17 NOTE — ED Notes (Signed)
Pt left with all his belongings and wheeled out to waiting room.

## 2015-03-17 NOTE — Discharge Instructions (Signed)

## 2015-03-17 NOTE — ED Notes (Signed)
Pt fell down while running as he tripped in a pot hole, landed on his face mostly.  Unsure of LOC, occurred around 2200, no n/v, no dizziness, pt has swelling to left eye and left lip and an abrasion above left eyebrow.

## 2015-03-17 NOTE — ED Provider Notes (Signed)
CSN: [id number]     Arrival date & time 03/17/15  0045 History  This chart was scribed for  Shon Baton, MD by Bethel Born, ED Scribe. This patient was seen in room B14C/B14C and the patient's care was started at 1:10 AM.  Chief Complaint  Patient presents with  . Fall   The history is provided by the patient. No language interpreter was used.   [facility] is a 24 y.o. male who presents to the Emergency Department complaining of fall 2-3 hours ago. The pt was running down the street to help his seizing mother when he stepped in a pot hole and fell forward onto his face. Associated symptoms include facial pain, left eye swelling, lower lip swelling, left arm pain, and diffuse left sided pain. The pain is constant and rated 10/10 in severity. He took nothing for pain PTA. ?  LOC. Pt denies nausea,vomiting, chest pain, and SOB. Tetanus is out of date.   Past Medical History  Diagnosis Date  . Asthma   . Hypertension 08/2013  . 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 ACL injury tear    Past Surgical History  Procedure Laterality Date  . Anterior cruciate ligament repair      R Knee   No family history on file. History  Substance Use Topics  . Smoking status: Current Every Day Smoker -- 0.50 packs/day    Types: Cigarettes  . Smokeless tobacco: Not on file  . Alcohol Use: Yes     Comment: 1/2 gallon every 2 weeks - states he hasnt drank in 2012    Review of Systems  Constitutional: Negative.  Negative for fever.  Respiratory: Negative.  Negative for chest tightness and shortness of breath.   Cardiovascular: Negative.  Negative for chest pain.  Gastrointestinal: Negative.  Negative for nausea, vomiting and abdominal pain.  Genitourinary: Negative.  Negative for dysuria.  Musculoskeletal: Positive for myalgias. Negative for back pain, neck pain and neck stiffness.  Skin: Positive for wound. Negative for rash.  Neurological: Negative for dizziness and headaches.  All other systems  reviewed and are negative.     Allergies  Review of patient's allergies indicates no known allergies.  Home Medications   Prior to Admission medications   Medication Sig Start Date End Date Taking? Authorizing Provider  cyclobenzaprine (FLEXERIL) 10 MG tablet Take 1 tablet (10 mg total) by mouth at bedtime. Patient not taking: Reported on 03/17/2015 01/15/15   Charlestine Night, PA-C  HYDROcodone-acetaminophen (NORCO/VICODIN) 5-325 MG per tablet Take 1 tablet by mouth every 6 (six) hours as needed for moderate pain. Patient not taking: Reported on 03/17/2015 01/15/15   Charlestine Night, PA-C  ibuprofen (ADVIL,MOTRIN) 800 MG tablet Take 1 tablet (800 mg total) by mouth every 8 (eight) hours as needed. Patient not taking: Reported on 03/17/2015 01/15/15   Charlestine Night, PA-C  methocarbamol (ROBAXIN) 500 MG tablet Take 2 tablets (1,000 mg total) by mouth every 8 (eight) hours as needed for muscle spasms. Patient not taking: Reported on 03/17/2015 12/11/14   Loren Racer, MD  oxyCODONE-acetaminophen (PERCOCET/ROXICET) 5-325 MG per tablet Take 1 tablet by mouth every 6 (six) hours as needed for severe pain. 03/17/15   Shon Baton, MD   BP 125/75 mmHg  Pulse 85  Temp(Src) 98 F (36.7 C) (Oral)  Resp 18  Ht 6' (1.829 m)  Wt 280 lb (127.007 kg)  BMI 37.97 kg/m2  SpO2 99% Physical Exam  Constitutional: He is oriented to person, place, and time. He  appears well-developed and well-nourished. No distress.  HENT:  Head: Normocephalic.  Mouth/Throat: Oropharynx is clear and moist.  Swelling noted over the left side of the lip, tenderness to palpation of the left midface, abrasion noted over the left eyebrow, midface is otherwise stable, no hemotympanum  Eyes: EOM are normal. Pupils are equal, round, and reactive to light.  Neck: Normal range of motion. Neck supple.  Cardiovascular: Normal rate, regular rhythm and normal heart sounds.   No murmur heard. Pulmonary/Chest: Effort  normal and breath sounds normal. No respiratory distress. He has no wheezes. He exhibits tenderness.  No crepitus  Abdominal: Soft. Bowel sounds are normal. There is no tenderness. There is no rebound and no guarding.  Musculoskeletal: He exhibits no edema.  Normal range of motion of the bilateral hips, knees, shoulders, and elbows, no obvious deformities  Neurological: He is alert and oriented to person, place, and time.  Skin: Skin is warm and dry.  Psychiatric: He has a normal mood and affect.  Nursing note and vitals reviewed.   ED Course  Procedures  DIAGNOSTIC STUDIES: Oxygen Saturation is 99% on RA, normal by my interpretation.    COORDINATION OF CARE: 1:15 AM Discussed treatment plan which includes CT maxillofacial, CXR, Tdap, and Percocet/Roxicet with pt at bedside and pt agreed to plan.  Labs Review Labs Reviewed - No data to display  Imaging Review Dg Chest 2 View  03/17/2015   CLINICAL DATA:  Larey Seat while running, tripped in a pothole and landed on his face. Incident occurred around 22:00.  EXAM: CHEST  2 VIEW  COMPARISON:  01/15/2015  FINDINGS: The heart size and mediastinal contours are within normal limits. Both lungs are clear. The visualized skeletal structures are unremarkable.  IMPRESSION: No active cardiopulmonary disease.   Electronically Signed   By: Ellery Plunk M.D.   On: 03/17/2015 02:17   Ct Maxillofacial Wo Cm  03/17/2015   CLINICAL DATA:  Fall onto face 2 hours prior, now with facial pain and left eye swelling.  EXAM: CT MAXILLOFACIAL WITHOUT CONTRAST  TECHNIQUE: Multidetector CT imaging of the maxillofacial structures was performed. Multiplanar CT image reconstructions were also generated. A small metallic BB was placed on the right temple in order to reliably differentiate right from left.  COMPARISON:  None.  FINDINGS: No facial bone fracture. The orbits and globes are intact. Nasal bone, mandibles, zygomatic arches, and pterygoid plates are intact. There  is left supraorbital soft tissue edema. Poor dentition with multifocal periapical lucencies about the upper and lower teeth bilaterally. Mucous retention cyst in the right maxillary sinus. Scattered mucosal thickening of the both maxillary sinuses and ethmoid air cells.  IMPRESSION: 1. No facial bone fracture. 2. Left periorbital soft tissue edema.  No orbital fracture. 3. Periapical lucencies about bilateral teeth consistent with periodontal disease. Full dental evaluation recommended. 4. Mild inflammatory change of the paranasal sinuses.   Electronically Signed   By: Rubye Oaks M.D.   On: 03/17/2015 03:22     EKG Interpretation None      MDM   Final diagnoses:  Fall, initial encounter  Contusion  Abrasion   A short presents following a fall. Described as a mechanical fall. Obvious abrasion and contusion over the face. Otherwise nontoxic. No other signs or symptoms of injury. Given tenderness over the midface, CT scan of the face obtained. Negative for acute fracture. Chest x-ray is negative. Patient was given Percocet. He has been ambulatory. Doubt life-threatening traumatic injury. Discussed the patient pain management  at home. Tetanus was updated.  After history, exam, and medical workup I feel the patient has been appropriately medically screened and is safe for discharge home. Pertinent diagnoses were discussed with the patient. Patient was given return precautions.  I personally performed the services described in this documentation, which was scribed in my presence. The recorded information has been reviewed and is accurate.      Shon Baton, MD 03/17/15 0500

## 2016-06-07 ENCOUNTER — Encounter (HOSPITAL_COMMUNITY): Payer: Self-pay | Admitting: Emergency Medicine

## 2016-06-07 ENCOUNTER — Emergency Department (HOSPITAL_COMMUNITY)
Admission: EM | Admit: 2016-06-07 | Discharge: 2016-06-07 | Disposition: A | Discharge status: Home / Self Care | Payer: Medicaid Other | Attending: Emergency Medicine | Admitting: Emergency Medicine

## 2016-06-07 DIAGNOSIS — I10 Essential (primary) hypertension: Secondary | ICD-10-CM | POA: Diagnosis not present

## 2016-06-07 DIAGNOSIS — J45909 Unspecified asthma, uncomplicated: Secondary | ICD-10-CM | POA: Insufficient documentation

## 2016-06-07 DIAGNOSIS — K0889 Other specified disorders of teeth and supporting structures: Secondary | ICD-10-CM | POA: Insufficient documentation

## 2016-06-07 DIAGNOSIS — F17210 Nicotine dependence, cigarettes, uncomplicated: Secondary | ICD-10-CM | POA: Insufficient documentation

## 2016-06-07 MED ORDER — PENICILLIN V POTASSIUM 500 MG PO TABS: 500.0000 mg | ORAL_TABLET | Freq: Four times a day (QID) | ORAL | 0 refills | Status: DC

## 2016-06-07 MED ORDER — ACETAMINOPHEN 500 MG PO TABS
1000.0000 mg | ORAL_TABLET | Freq: Once | ORAL | Status: AC
  Administered 2016-06-07: 1000 mg via ORAL
  Filled 2016-06-07: qty 2

## 2016-06-07 NOTE — ED Provider Notes (Signed)
[facility]-EMERGENCY DEPT Provider Note   CSN: [id number] Arrival date & time: 06/07/16  1541     History   Chief Complaint Chief Complaint  Patient presents with  . Dental Pain    HPI Benjamin Garcia is a 25 y.o. male.  Patient presents to the emergency department with a dental complaint. Symptoms began 2 weeks ago. The patient has tried to alleviate pain with orajel only.  Pain rated at a 10/10, characterized as throbbing in nature and located right lower rear molars. Patient denies fever, night sweats, chills, difficulty swallowing or opening mouth, SOB, nuchal rigidity or decreased ROM of neck.  Patient does not have a dentist and requests a resource guide at discharge.       Past Medical History:  Diagnosis Date  . ACL injury tear   . Asthma   . 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 Hypertension 08/2013    There are no active problems to display for this patient.   Past Surgical History:  Procedure Laterality Date  . ANTERIOR CRUCIATE LIGAMENT REPAIR     R Knee       Home Medications    Prior to Admission medications   Medication Sig Start Date End Date Taking? Authorizing Provider  cyclobenzaprine (FLEXERIL) 10 MG tablet Take 1 tablet (10 mg total) by mouth at bedtime. Patient not taking: Reported on 03/17/2015 01/15/15   Charlestine Nighthristopher Lawyer, PA-C  HYDROcodone-acetaminophen (NORCO/VICODIN) 5-325 MG per tablet Take 1 tablet by mouth every 6 (six) hours as needed for moderate pain. Patient not taking: Reported on 03/17/2015 01/15/15   Charlestine Nighthristopher Lawyer, PA-C  ibuprofen (ADVIL,MOTRIN) 800 MG tablet Take 1 tablet (800 mg total) by mouth every 8 (eight) hours as needed. Patient not taking: Reported on 03/17/2015 01/15/15   Charlestine Nighthristopher Lawyer, PA-C  methocarbamol (ROBAXIN) 500 MG tablet Take 2 tablets (1,000 mg total) by mouth every 8 (eight) hours as needed for muscle spasms. Patient not taking: Reported on 03/17/2015 12/11/14   Loren Raceravid Yelverton, MD  oxyCODONE-acetaminophen  (PERCOCET/ROXICET) 5-325 MG per tablet Take 1 tablet by mouth every 6 (six) hours as needed for severe pain. 03/17/15   Shon Batonourtney F Horton, MD  penicillin v potassium (VEETID) 500 MG tablet Take 1 tablet (500 mg total) by mouth 4 (four) times daily. 06/07/16   Roxy Horsemanobert Exa Bomba, PA-C    Family History No family history on file.  Social History Social History  Substance Use Topics  . Smoking status: Current Every Day Smoker    Packs/day: 0.50    Years: 5.00    Types: Cigarettes  . Smokeless tobacco: Never Used  . Alcohol use Yes     Comment: 1/2 gallon every 2 weeks - states he hasnt drank in 2012     Allergies   Review of patient's allergies indicates no known allergies.   Review of Systems Review of Systems  Constitutional: Negative for chills and fever.  HENT: Positive for dental problem. Negative for drooling.   Neurological: Negative for speech difficulty.  Psychiatric/Behavioral: Positive for sleep disturbance.     Physical Exam Updated Vital Signs BP (!) 161/102 (BP Location: Right Arm)   Pulse 100   Temp 98.6 F (37 C) (Oral)   Resp 22   Ht 5\' 11"  (1.803 m)   Wt (!) 137.6 kg   SpO2 98%   BMI 42.31 kg/m   Physical Exam Physical Exam  Constitutional: Pt appears well-developed and well-nourished.  HENT:  Head: Normocephalic.  Right Ear: Tympanic membrane, external ear and ear canal normal.  Left Ear: Tympanic membrane, external ear and ear canal normal.  Nose: Nose normal. Right sinus exhibits no maxillary sinus tenderness and no frontal sinus tenderness. Left sinus exhibits no maxillary sinus tenderness and no frontal sinus tenderness.  Mouth/Throat: Uvula is midline, oropharynx is clear and moist and mucous membranes are normal. No oral lesions. No uvula swelling or lacerations. No oropharyngeal exudate, posterior oropharyngeal edema, posterior oropharyngeal erythema or tonsillar abscesses.  Poor dentition No gingival swelling, fluctuance or induration No  gross abscess  No sublingual edema, tenderness to palpation, or sign of Ludwig's angina, or deep space infection Pain at right lower rear molar Eyes: Conjunctivae are normal. Pupils are equal, round, and reactive to light. Right eye exhibits no discharge. Left eye exhibits no discharge.  Neck: Normal range of motion. Neck supple.  No stridor Handling secretions without difficulty No nuchal rigidity No cervical lymphadenopathy Cardiovascular: Normal rate, regular rhythm and normal heart sounds.   Pulmonary/Chest: Effort normal. No respiratory distress.  Equal chest rise  Abdominal: Soft. Bowel sounds are normal. Pt exhibits no distension. There is no tenderness.  Lymphadenopathy: Pt has no cervical adenopathy.  Neurological: Pt is alert and oriented x 4  Skin: Skin is warm and dry.  Psychiatric: Pt has a normal mood and affect.  Nursing note and vitals reviewed.    ED Treatments / Results  Labs (all labs ordered are listed, but only abnormal results are displayed) Labs Reviewed - No data to display  EKG  EKG Interpretation None       Radiology No results found.  Procedures Procedures (including critical care time)  Medications Ordered in ED Medications  acetaminophen (TYLENOL) tablet 1,000 mg (1,000 mg Oral Given 06/07/16 1607)     Initial Impression / Assessment and Plan / ED Course  I have reviewed the triage vital signs and the nursing notes.  Pertinent labs & imaging results that were available during my care of the patient were reviewed by me and considered in my medical decision making (see chart for details).  Clinical Course    Patient with dentalgia.  No abscess requiring immediate incision and drainage.  Exam not concerning for Ludwig's angina or pharyngeal abscess.  Will treat with penicillin. Pt instructed to follow-up with dentist.  Discussed return precautions. Pt safe for discharge.   Final Clinical Impressions(s) / ED Diagnoses   Final  diagnoses:  Pain, dental    New Prescriptions New Prescriptions   PENICILLIN V POTASSIUM (VEETID) 500 MG TABLET    Take 1 tablet (500 mg total) by mouth 4 (four) times daily.     Roxy Horseman, PA-C 06/07/16 1624    Loren Racer, MD 06/13/16 [phone]

## 2016-06-07 NOTE — ED Triage Notes (Signed)
Pt states he has been having a toothache on the right side for 3 weeks. Pt states he has not tried to take any over the counter meds. States he has used orajel but it has not helped. Pt also reports a headache with same. Unable to note swelling to the right side.

## 2016-06-07 NOTE — ED Notes (Signed)
Declined W/C at D/C and was escorted to lobby by RN. 

## 2020-01-14 ENCOUNTER — Encounter (HOSPITAL_COMMUNITY): Payer: Self-pay | Admitting: *Deleted

## 2020-01-14 ENCOUNTER — Other Ambulatory Visit: Payer: Self-pay

## 2020-01-14 ENCOUNTER — Emergency Department (HOSPITAL_COMMUNITY)
Admission: EM | Admit: 2020-01-14 | Discharge: 2020-01-14 | Disposition: A | Discharge status: Home / Self Care | Payer: Medicaid Other | Attending: Emergency Medicine | Admitting: Emergency Medicine

## 2020-01-14 DIAGNOSIS — J45909 Unspecified asthma, uncomplicated: Secondary | ICD-10-CM | POA: Insufficient documentation

## 2020-01-14 DIAGNOSIS — I10 Essential (primary) hypertension: Secondary | ICD-10-CM | POA: Insufficient documentation

## 2020-01-14 DIAGNOSIS — K0381 Cracked tooth: Secondary | ICD-10-CM | POA: Diagnosis not present

## 2020-01-14 DIAGNOSIS — K0889 Other specified disorders of teeth and supporting structures: Secondary | ICD-10-CM | POA: Diagnosis present

## 2020-01-14 DIAGNOSIS — K029 Dental caries, unspecified: Secondary | ICD-10-CM | POA: Diagnosis not present

## 2020-01-14 DIAGNOSIS — F17210 Nicotine dependence, cigarettes, uncomplicated: Secondary | ICD-10-CM | POA: Insufficient documentation

## 2020-01-14 MED ORDER — BUPIVACAINE-EPINEPHRINE (PF) 0.5% -1:200000 IJ SOLN: 1.8000 mL | Freq: Once | INTRAMUSCULAR | Status: DC

## 2020-01-14 MED ORDER — BENZOCAINE 20 % MT PSTE: 1.0000 "application " | PASTE | Freq: Three times a day (TID) | OROMUCOSAL | 1 refills | Status: AC | PRN

## 2020-01-14 NOTE — ED Provider Notes (Signed)
[facility] Provider Note   CSN: [id number] Arrival date & time: 01/14/20  1510     History Chief Complaint  Patient presents with  . Dental Pain    [facility] Garcia is a 29 y.o. male.  HPI   Patient presents for 2 weeks of dental pain.  He has this on the left side both superior and inferior.  He has not had any swelling of his face, he has had no respiratory impact, he is able to open and close his mouth.  He is able to swallow although he is stopped eating largely due to pain response whenever something touches his tooth.  He denies any injury and loss of consciousness or reduced range of motion of his neck  Past Medical History:  Diagnosis Date  . ACL injury tear   . Asthma   . 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 Hypertension 08/2013    There are no problems to display for this patient.   Past Surgical History:  Procedure Laterality Date  . ANTERIOR CRUCIATE LIGAMENT REPAIR     R Knee       No family history on file.  Social History   Tobacco Use  . Smoking status: Current Every Day Smoker    Packs/day: 0.50    Years: 5.00    Pack years: 2.50    Types: Cigarettes  . Smokeless tobacco: Never Used  Substance Use Topics  . Alcohol use: Yes    Comment: 1/2 gallon every 2 weeks - states he hasnt drank in 2012  . Drug use: No    Home Medications Prior to Admission medications   Medication Sig Start Date End Date Taking? Authorizing Provider  benzocaine (ORABASE-B) 20 % PSTE Use as directed 1 application in the mouth or throat 3 (three) times daily as needed for up to 4 days for mouth pain. 01/14/20 01/18/20  Marthenia Rolling, DO  cyclobenzaprine (FLEXERIL) 10 MG tablet Take 1 tablet (10 mg total) by mouth at bedtime. Patient not taking: Reported on 03/17/2015 01/15/15   Charlestine Night, PA-C  HYDROcodone-acetaminophen (NORCO/VICODIN) 5-325 MG per tablet Take 1 tablet by mouth every 6 (six) hours as needed for moderate pain. Patient not  taking: Reported on 03/17/2015 01/15/15   Lawyer, Cristal Deer, PA-C  ibuprofen (ADVIL,MOTRIN) 800 MG tablet Take 1 tablet (800 mg total) by mouth every 8 (eight) hours as needed. Patient not taking: Reported on 03/17/2015 01/15/15   Lawyer, Cristal Deer, PA-C  methocarbamol (ROBAXIN) 500 MG tablet Take 2 tablets (1,000 mg total) by mouth every 8 (eight) hours as needed for muscle spasms. Patient not taking: Reported on 03/17/2015 12/11/14   Loren Racer, MD  oxyCODONE-acetaminophen (PERCOCET/ROXICET) 5-325 MG per tablet Take 1 tablet by mouth every 6 (six) hours as needed for severe pain. 03/17/15   Horton, Mayer Masker, MD  penicillin v potassium (VEETID) 500 MG tablet Take 1 tablet (500 mg total) by mouth 4 (four) times daily. 06/07/16   Roxy Horseman, PA-C    Allergies    Patient has no known allergies.  Review of Systems   Review of Systems  Constitutional: Negative.   HENT: Positive for dental problem. Negative for drooling, ear discharge, ear pain, facial swelling, hearing loss, mouth sores, nosebleeds, postnasal drip, rhinorrhea, sinus pressure, sinus pain, sneezing, sore throat, trouble swallowing and voice change.   Eyes: Negative.   Respiratory: Negative.   Cardiovascular: Negative.   Gastrointestinal: Negative.   Genitourinary: Negative.   Musculoskeletal: Negative.   Skin: Negative.  Psychiatric/Behavioral: Negative.     Physical Exam Updated Vital Signs BP (!) 148/97 (BP Location: Left Arm)   Pulse (!) 103   Temp 98.7 F (37.1 C) (Oral)   Resp 20   Ht 5\' 11"  (1.803 m)   Wt (!) 163.3 kg   SpO2 100%   BMI 50.21 kg/m   Physical Exam Vitals and nursing note reviewed.  Constitutional:      Appearance: He is obese.  HENT:     Head: Normocephalic.     Nose: Nose normal. No congestion.     Mouth/Throat:     Lips: Pink. No lesions.     Dentition: Abnormal dentition. Does not have dentures. Dental tenderness and dental caries present. No gingival swelling, dental  abscesses or gum lesions.     Tongue: No lesions. Tongue does not deviate from midline.     Palate: No mass and lesions.     Pharynx: No oropharyngeal exudate or uvula swelling.     Tonsils: No tonsillar exudate or tonsillar abscesses.      Comments: Both teeth, broken off at the gumline, no indication of dry socket, no indication of gingival abscess, no Ludwig angina, no tonsillar hypertrophy, uvula midline, no respiratory distress Eyes:     General: No scleral icterus. Cardiovascular:     Comments: Tachycardia to low 100s, regular rhythm Pulmonary:     Effort: Pulmonary effort is normal.     Breath sounds: Normal breath sounds.  Abdominal:     General: There is no distension.     Palpations: Abdomen is soft.     Tenderness: There is no abdominal tenderness. There is no guarding.  Neurological:     General: No focal deficit present.     Mental Status: He is alert and oriented to person, place, and time.  Psychiatric:        Mood and Affect: Mood normal.        Behavior: Behavior normal.        Thought Content: Thought content normal.        Judgment: Judgment normal.     ED Results / Procedures / Treatments   Labs (all labs ordered are listed, but only abnormal results are displayed) Labs Reviewed - No data to display  EKG None  Radiology No results found.  Procedures .Nerve Block  Date/Time: 01/14/2020 6:10 PM Performed by: Sherene Sires, DO Authorized by: Deno Etienne, DO   Consent:    Consent obtained:  Verbal   Consent given by:  Patient   Risks discussed:  Swelling, unsuccessful block, pain and bleeding   Alternatives discussed:  No treatment Indications:    Indications:  Pain relief Location:    Body area:  Head (Posterior superior alveolar and inferior alveolar) Procedure details (see MAR for exact dosages):    Block needle gauge:  24 G   Anesthetic injected:  Bupivacaine 0.5% w/o epi   Steroid injected:  None   Additive injected:  None   Injection  procedure:  Anatomic landmarks identified, negative aspiration for blood, anatomic landmarks palpated and introduced needle Post-procedure details:    Dressing:  None   Outcome:  Pain relieved   Patient tolerance of procedure:  Tolerated well, no immediate complications   (including critical care time)  Medications Ordered in ED Medications  bupivacaine-epinephrine (MARCAINE W/ EPI) 0.5% -1:200000 injection 1.8 mL (has no administration in time range)    ED Course  I have reviewed the triage vital signs and the nursing notes.  Pertinent labs & imaging results that were available during my care of the patient were reviewed by me and considered in my medical decision making (see chart for details).    MDM Rules/Calculators/A&P                      Patient with no surgical indications immediately, does have significant pain to dental caries but no sign of infection accumulation or abscess.  We did give lidocaine prior to attempt at superior and inferior alveolar nerve block, both were successful and patient was reassessed and that after with significant reduction in pain.  He said he was content with discharge home to follow-up with PCP on Monday.  Will give prescription for a few doses of bupivacaine paste.   Final Clinical Impression(s) / ED Diagnoses Final diagnoses:  Tooth pain    Rx / DC Orders ED Discharge Orders         Ordered    benzocaine (ORABASE-B) 20 % PSTE  3 times daily PRN     01/14/20 1804           Marthenia Rolling, DO 01/14/20 1813    Melene Plan, DO 01/14/20 1818

## 2020-01-14 NOTE — ED Triage Notes (Signed)
The pt arrived w/c by [facility]  Pt c/o lt jaw pain and a toothache for 2-3 days  He reports that he has taken advil and tylenol for the pain

## 2020-01-14 NOTE — Discharge Instructions (Addendum)
I am going to prescribe you some of the patient that we used in the emergency department so they can help with your tooth pain, the only real solution is to get to a dentist however so it is important you call your primary care physician the next day they are open which is likely Tuesday.  Ask them for your help finding a dentist that accepts Medicaid.  You might need to literally use [employer] and just start calling every dentist until you find one.

## 2020-01-16 ENCOUNTER — Encounter (HOSPITAL_COMMUNITY): Payer: Self-pay

## 2020-01-16 ENCOUNTER — Emergency Department (HOSPITAL_COMMUNITY)
Admission: EM | Admit: 2020-01-16 | Discharge: 2020-01-16 | Disposition: A | Discharge status: Home / Self Care | Payer: Medicaid Other | Attending: Emergency Medicine | Admitting: Emergency Medicine

## 2020-01-16 ENCOUNTER — Other Ambulatory Visit: Payer: Self-pay

## 2020-01-16 DIAGNOSIS — G8929 Other chronic pain: Secondary | ICD-10-CM | POA: Insufficient documentation

## 2020-01-16 DIAGNOSIS — K029 Dental caries, unspecified: Secondary | ICD-10-CM | POA: Diagnosis not present

## 2020-01-16 DIAGNOSIS — K0889 Other specified disorders of teeth and supporting structures: Secondary | ICD-10-CM | POA: Insufficient documentation

## 2020-01-16 DIAGNOSIS — K089 Disorder of teeth and supporting structures, unspecified: Secondary | ICD-10-CM

## 2020-01-16 MED ORDER — CLINDAMYCIN HCL 150 MG PO CAPS: 450.0000 mg | ORAL_CAPSULE | Freq: Three times a day (TID) | ORAL | 0 refills | Status: AC

## 2020-01-16 NOTE — ED Notes (Signed)
Patient verbalizes understanding of discharge instructions. Opportunity for questioning and answers were provided. Pt discharged from ED. 

## 2020-01-16 NOTE — Discharge Instructions (Addendum)
You were seen in the ER for dental pain.  Pain is likely from broken tooth/exposed nerve endings from cavities.  There are no signs of superimposed infection or collection of pus to require drainage but you are at high risk for abscess and infection.  ° °Take antibiotic as prescribed. This should help with an early infection and the pain.  ° °Dental pain is difficult to control because most of the pain is from exposed nerve endings.  Take 1000 mg of acetaminophen (Tylenol) every 6 hours.  For more pain control take ibuprofen 600 mg every 6 hours.  You can combine acetaminophen and ibuprofen as above every 6 hours for maximum pain and inflammation control.  Use pea sized amount of Orajel 15 to 20 minutes around area of pain and exposed nerves prior to eating or drinking to desensitize nerve endings.  You can apply a pea sized amount of desensitizing toothpastes (Sensodyne, Pronamel, [employer] sensitive) throughout the day to help with nerve pain and sensitivity.  Warm water and salt soaks can help with inflammation and drainage formation. ° °Unfortunately, dental pain will continue until you have a full dental evaluation and treatment.  See dental resources attached.  Additionally, I have given you Dr. Turner and Dr. Farless contact information, they frequently try to accommodate patients from the ER. ° °Return to the ER for fevers, facial or gum line swelling, redness, pus. ° °

## 2020-01-16 NOTE — ED Triage Notes (Signed)
Patient arrived by [facility] for ongoing toothache. Seen yesterday for same and did not pick up medications. States came for pain meds

## 2020-01-16 NOTE — ED Provider Notes (Signed)
[facility] EMERGENCY DEPARTMENT Provider Note   CSN: [id number] Arrival date & time: 01/16/20  1245     History No chief complaint on file.   Benjamin Garcia is a 29 y.o. male brought to the ER by EMS for evaluation of left-sided jaw pain.  Patient states this pain has been there for approximately 2 months.  Is located in the left lower jaw and it radiates all across his mandible to the right side.  It is worse with palpation, eating and drinking.  Patient came to the ER 2 days ago and had a dental block.  I asked him if he had taken his antibiotics but states he did not have any.  It does not look like he was discharged with antibiotics at that time.  Denies any fevers or difficulty swallowing saliva, changes in his voice.  States he has not been able to see a Education officer, community. HPI     Past Medical History:  Diagnosis Date  . ACL injury tear   . Asthma   . 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 Hypertension 08/2013    There are no problems to display for this patient.   Past Surgical History:  Procedure Laterality Date  . ANTERIOR CRUCIATE LIGAMENT REPAIR     R Knee       No family history on file.  Social History   Tobacco Use  . Smoking status: Current Every Day Smoker    Packs/day: 0.50    Years: 5.00    Pack years: 2.50    Types: Cigarettes  . Smokeless tobacco: Never Used  Substance Use Topics  . Alcohol use: Yes    Comment: 1/2 gallon every 2 weeks - states he hasnt drank in 2012  . Drug use: No    Home Medications Prior to Admission medications   Medication Sig Start Date End Date Taking? Authorizing Provider  benzocaine (ORABASE-B) 20 % PSTE Use as directed 1 application in the mouth or throat 3 (three) times daily as needed for up to 4 days for mouth pain. 01/14/20 01/18/20  Marthenia Rolling, DO  clindamycin (CLEOCIN) 150 MG capsule Take 3 capsules (450 mg total) by mouth 3 (three) times daily for 7 days. 01/16/20 01/23/20  Liberty Handy, PA-C  cyclobenzaprine  (FLEXERIL) 10 MG tablet Take 1 tablet (10 mg total) by mouth at bedtime. Patient not taking: Reported on 03/17/2015 01/15/15   Charlestine Night, PA-C  HYDROcodone-acetaminophen (NORCO/VICODIN) 5-325 MG per tablet Take 1 tablet by mouth every 6 (six) hours as needed for moderate pain. Patient not taking: Reported on 03/17/2015 01/15/15   Lawyer, Cristal Deer, PA-C  ibuprofen (ADVIL,MOTRIN) 800 MG tablet Take 1 tablet (800 mg total) by mouth every 8 (eight) hours as needed. Patient not taking: Reported on 03/17/2015 01/15/15   Lawyer, Cristal Deer, PA-C  methocarbamol (ROBAXIN) 500 MG tablet Take 2 tablets (1,000 mg total) by mouth every 8 (eight) hours as needed for muscle spasms. Patient not taking: Reported on 03/17/2015 12/11/14   Loren Racer, MD  oxyCODONE-acetaminophen (PERCOCET/ROXICET) 5-325 MG per tablet Take 1 tablet by mouth every 6 (six) hours as needed for severe pain. 03/17/15   Horton, Mayer Masker, MD  penicillin v potassium (VEETID) 500 MG tablet Take 1 tablet (500 mg total) by mouth 4 (four) times daily. 06/07/16   Roxy Horseman, PA-C    Allergies    Patient has no known allergies.  Review of Systems   Review of Systems  HENT: Positive for dental problem.  All other systems reviewed and are negative.   Physical Exam Updated Vital Signs BP (!) 159/98   Pulse 81   Temp 98.1 F (36.7 C) (Oral)   Resp (!) 22   Ht 5\' 11"  (1.803 m)   Wt (!) 163.3 kg   SpO2 100%   BMI 50.21 kg/m   Physical Exam Constitutional:      Appearance: He is well-developed.  HENT:     Head: Normocephalic.     Nose: Nose normal.     Mouth/Throat:      Comments: Poor dentition throughout.  Last molar on the left lower side is cracked.  Significant decay noted.  Patient has pain with percussion.  No significant surrounding gumline erythema, edema, fluctuance.  No signs of abscess.  No trismus.  Tolerating secretions.  Normal phonation.  Normal tongue protrusion. Eyes:     General: Lids are  normal.  Neck:     Comments: Diffuse left-sided mandibular and submandibular tenderness however no obvious signs of asymmetry, edema, fluctuance, crepitus in the anterior/lateral neck.  Full range of motion of the neck without any pain. Cardiovascular:     Rate and Rhythm: Normal rate.  Pulmonary:     Effort: Pulmonary effort is normal. No respiratory distress.  Musculoskeletal:        General: Normal range of motion.     Cervical back: Normal range of motion.  Neurological:     Mental Status: He is alert.  Psychiatric:        Behavior: Behavior normal.     ED Results / Procedures / Treatments   Labs (all labs ordered are listed, but only abnormal results are displayed) Labs Reviewed - No data to display  EKG None  Radiology No results found.  Procedures Procedures (including critical care time)  Medications Ordered in ED Medications - No data to display  ED Course  I have reviewed the triage vital signs and the nursing notes.  Pertinent labs & imaging results that were available during my care of the patient were reviewed by me and considered in my medical decision making (see chart for details).    MDM Rules/Calculators/A&P                      Highest on ddx is dentin/nerve exposure from cracked teeth causing nerve irritation.  Possible early infection. No signs of dental abscess on exam.  Afebrile, non toxic appearing, swallowing secretions well without hot potato voice, drooling, trismus, asymmetric facial or mandibular or maxillary edema. Clinically this is not consistent with Ludwig's angina, dental or facial infectious process or abscess, or other deep tissue infection in neck, jaw, face, throat.  Seen in ER 5/29 and had dental block, was not discharged with antibiotics. Will treat with abx, NSAIDs, warm salt soaks.  Encouraged patient to follow-up with dentist.  Patient given dentist contact info, encouraged to follow up for ultimate management of dental pain and  overall dental health. Strict ED return precautions given. Pt is aware of red flag symptoms that would warrant return to ED for re-evaluation and further treatment. Patient voices understanding and is agreeable to plan.     Final Clinical Impression(s) / ED Diagnoses Final diagnoses:  Chronic dental pain    Rx / DC Orders ED Discharge Orders         Ordered    clindamycin (CLEOCIN) 150 MG capsule  3 times daily     01/16/20 1557  Kinnie Feil, PA-C 01/16/20 1639    Daleen Bo, MD 01/16/20 [phone]

## 2020-11-12 ENCOUNTER — Institutional Professional Consult (permissible substitution): Payer: Medicaid Other | Admitting: Pulmonary Disease

## 2020-12-20 ENCOUNTER — Institutional Professional Consult (permissible substitution): Payer: Medicaid Other | Admitting: Pulmonary Disease

## 2021-10-29 ENCOUNTER — Other Ambulatory Visit: Payer: Self-pay

## 2021-10-29 ENCOUNTER — Encounter (HOSPITAL_COMMUNITY): Payer: Self-pay

## 2021-10-29 ENCOUNTER — Emergency Department (HOSPITAL_COMMUNITY): Payer: Medicaid Other

## 2021-10-29 ENCOUNTER — Emergency Department (HOSPITAL_COMMUNITY)
Admission: EM | Admit: 2021-10-29 | Discharge: 2021-10-29 | Disposition: A | Discharge status: Home / Self Care | Payer: Medicaid Other | Attending: Emergency Medicine | Admitting: Emergency Medicine

## 2021-10-29 DIAGNOSIS — W260XXA Contact with knife, initial encounter: Secondary | ICD-10-CM | POA: Diagnosis not present

## 2021-10-29 DIAGNOSIS — Z79899 Other long term (current) drug therapy: Secondary | ICD-10-CM | POA: Diagnosis not present

## 2021-10-29 DIAGNOSIS — Z23 Encounter for immunization: Secondary | ICD-10-CM | POA: Diagnosis not present

## 2021-10-29 DIAGNOSIS — I10 Essential (primary) hypertension: Secondary | ICD-10-CM | POA: Diagnosis not present

## 2021-10-29 DIAGNOSIS — S61412A Laceration without foreign body of left hand, initial encounter: Secondary | ICD-10-CM | POA: Insufficient documentation

## 2021-10-29 DIAGNOSIS — S6992XA Unspecified injury of left wrist, hand and finger(s), initial encounter: Secondary | ICD-10-CM | POA: Diagnosis present

## 2021-10-29 MED ORDER — AMLODIPINE BESYLATE 5 MG PO TABS
5.0000 mg | ORAL_TABLET | Freq: Once | ORAL | Status: AC
Start: 2021-10-29 — End: 2021-10-29
  Administered 2021-10-29: 5 mg via ORAL
  Filled 2021-10-29: qty 1

## 2021-10-29 MED ORDER — LIDOCAINE-EPINEPHRINE (PF) 2 %-1:200000 IJ SOLN
10.0000 mL | Freq: Once | INTRAMUSCULAR | Status: AC
  Administered 2021-10-29: 10 mL
  Filled 2021-10-29: qty 20

## 2021-10-29 MED ORDER — TETANUS-DIPHTH-ACELL PERTUSSIS 5-2.5-18.5 LF-MCG/0.5 IM SUSY
0.5000 mL | PREFILLED_SYRINGE | Freq: Once | INTRAMUSCULAR | Status: AC
  Administered 2021-10-29: 0.5 mL via INTRAMUSCULAR
  Filled 2021-10-29: qty 0.5

## 2021-10-29 MED ORDER — AMLODIPINE BESYLATE 5 MG PO TABS: 5.0000 mg | ORAL_TABLET | Freq: Every day | ORAL | 2 refills | Status: DC
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
## 2021-10-29 NOTE — ED Notes (Signed)
BP treated and prescription given, stressed to pt the importance of following up with his PC due to HTN ?
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
## 2022-06-01 IMAGING — CR DG HAND COMPLETE 3+V*L*
3 series · 3 of 3 positions shown · non-contrast
Comparison: None.

CLINICAL DATA: 1 inch laceration palmar aspect left hand

EXAM:
LEFT HAND - COMPLETE 3+ VIEW

[x hand pa left]
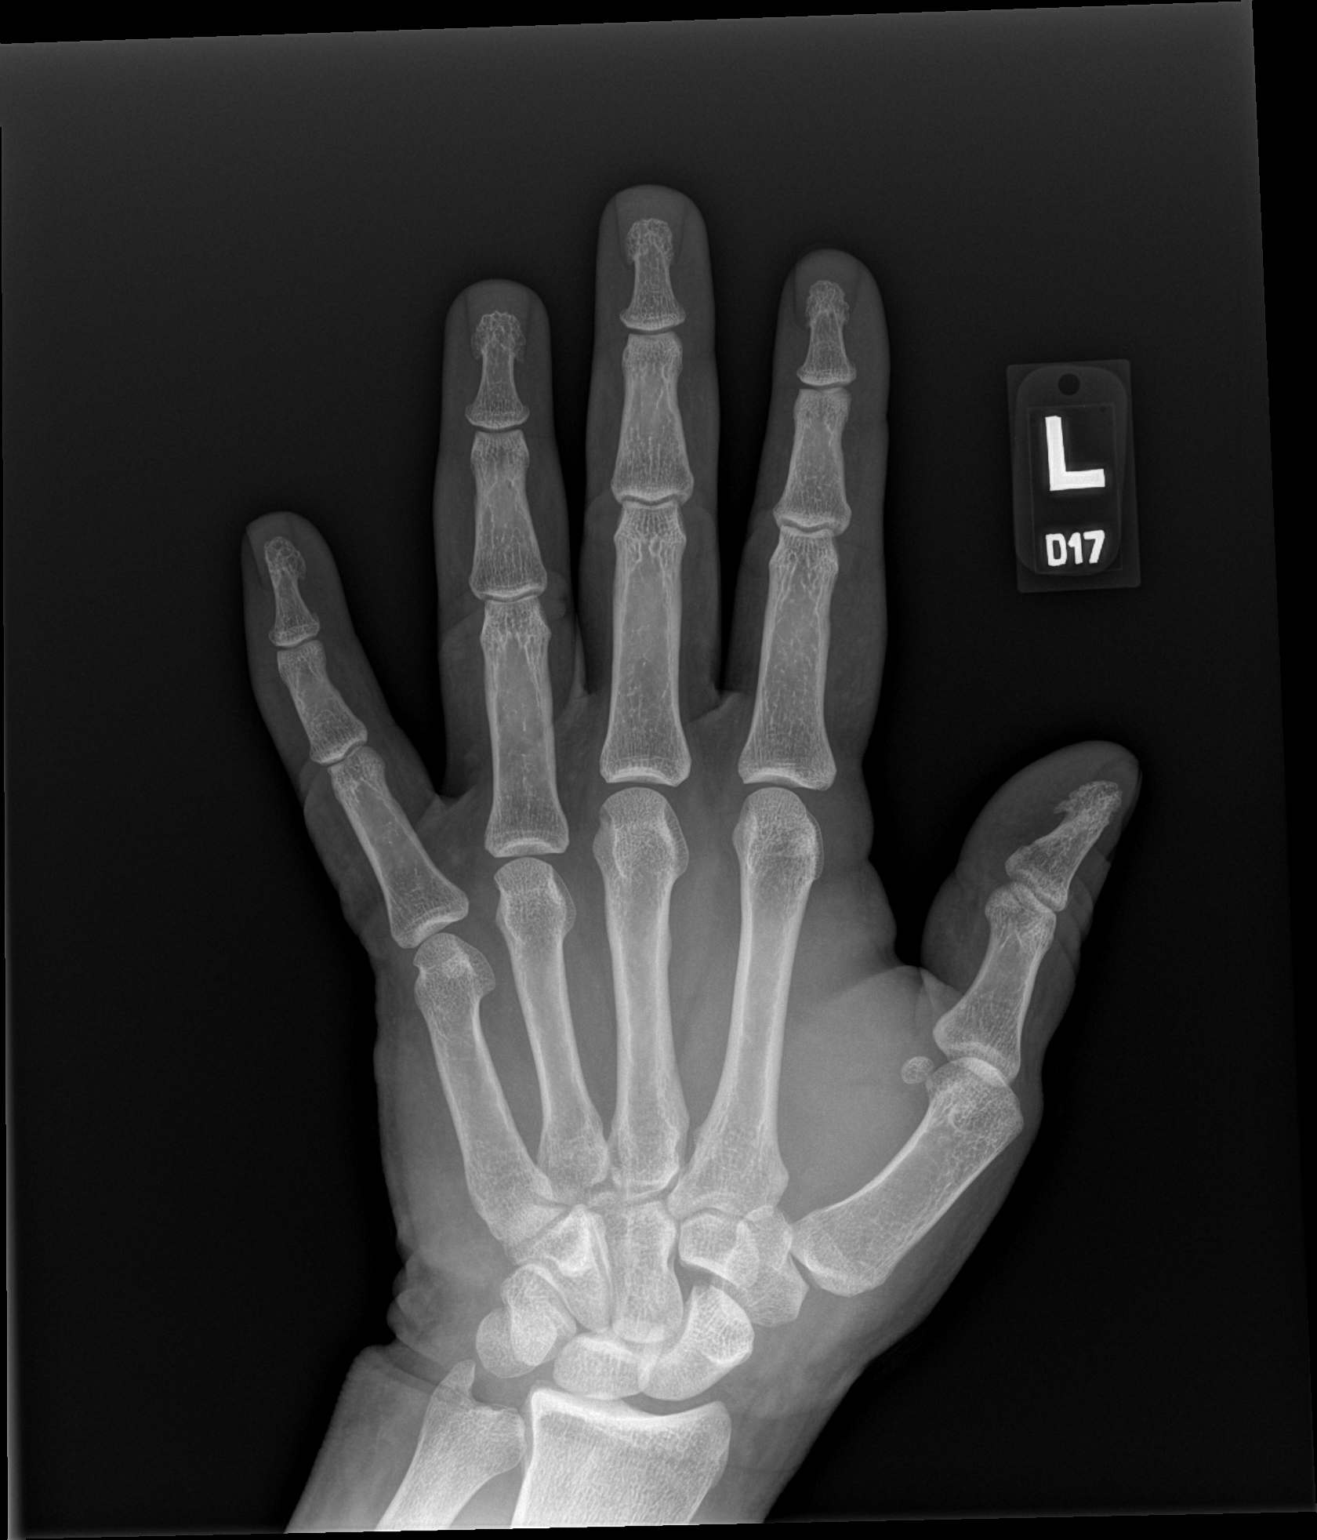

[x hand obl left]
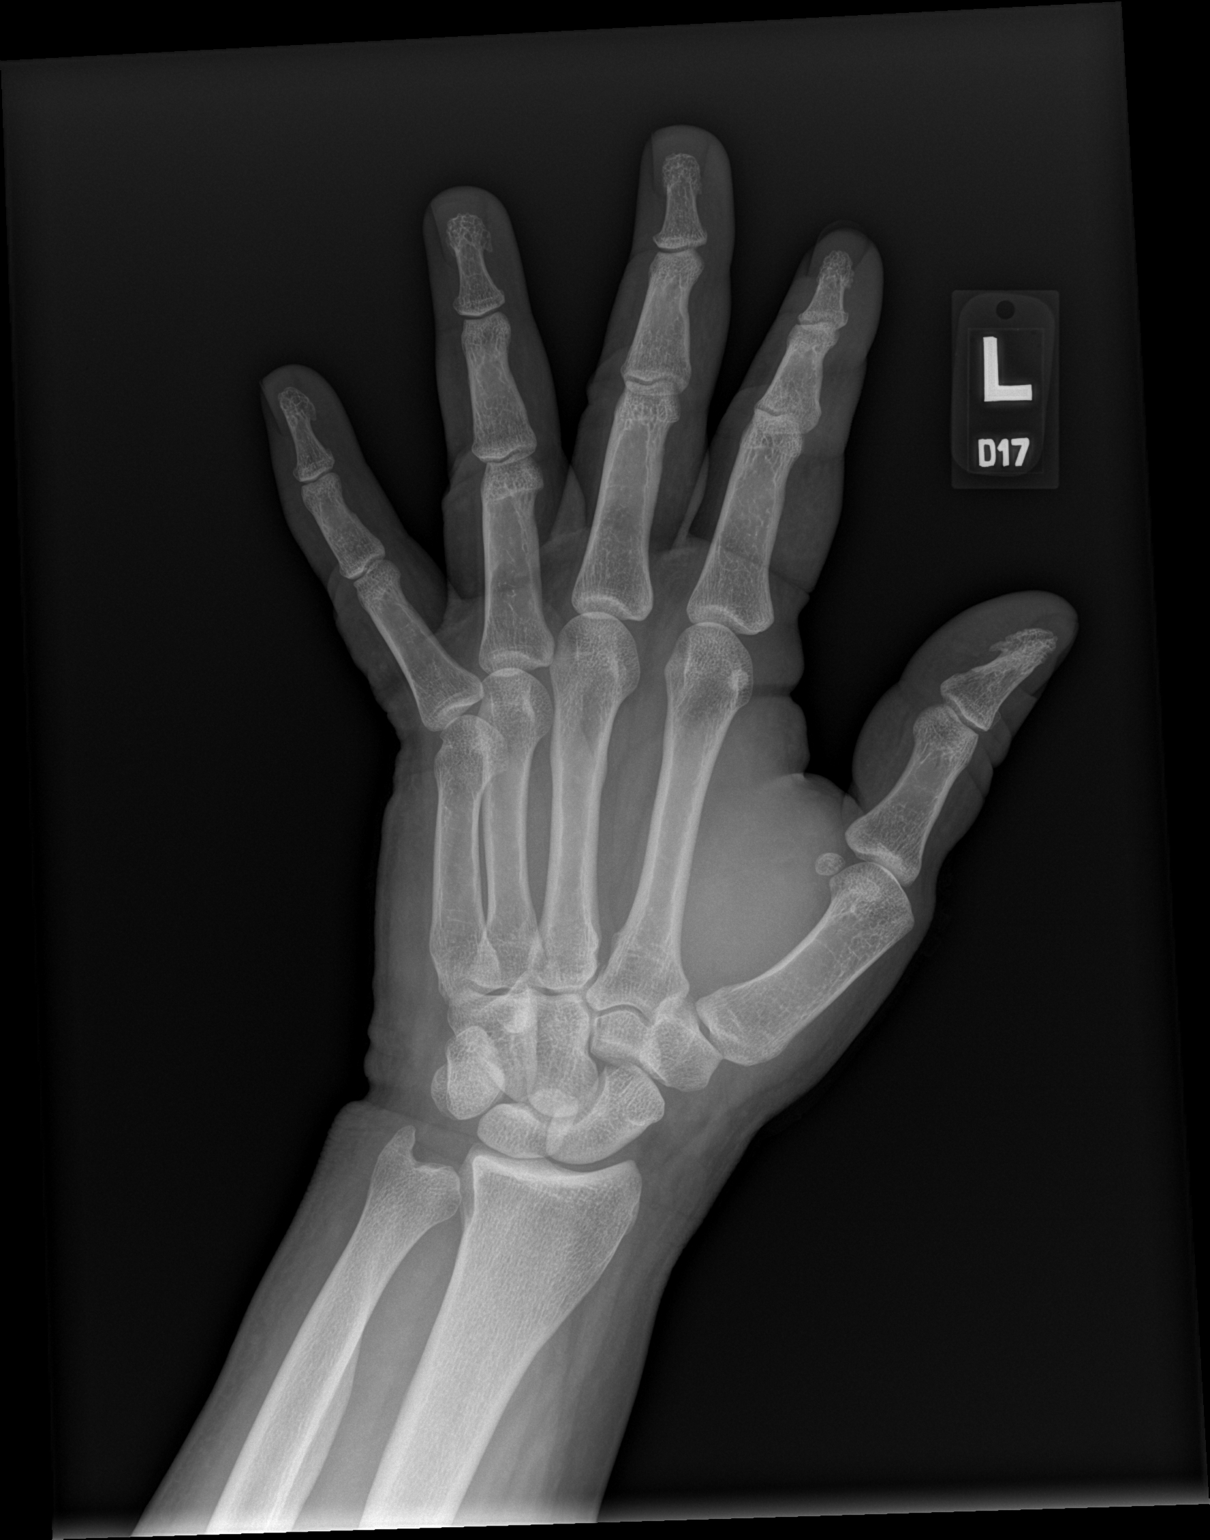

[x hand lat left]
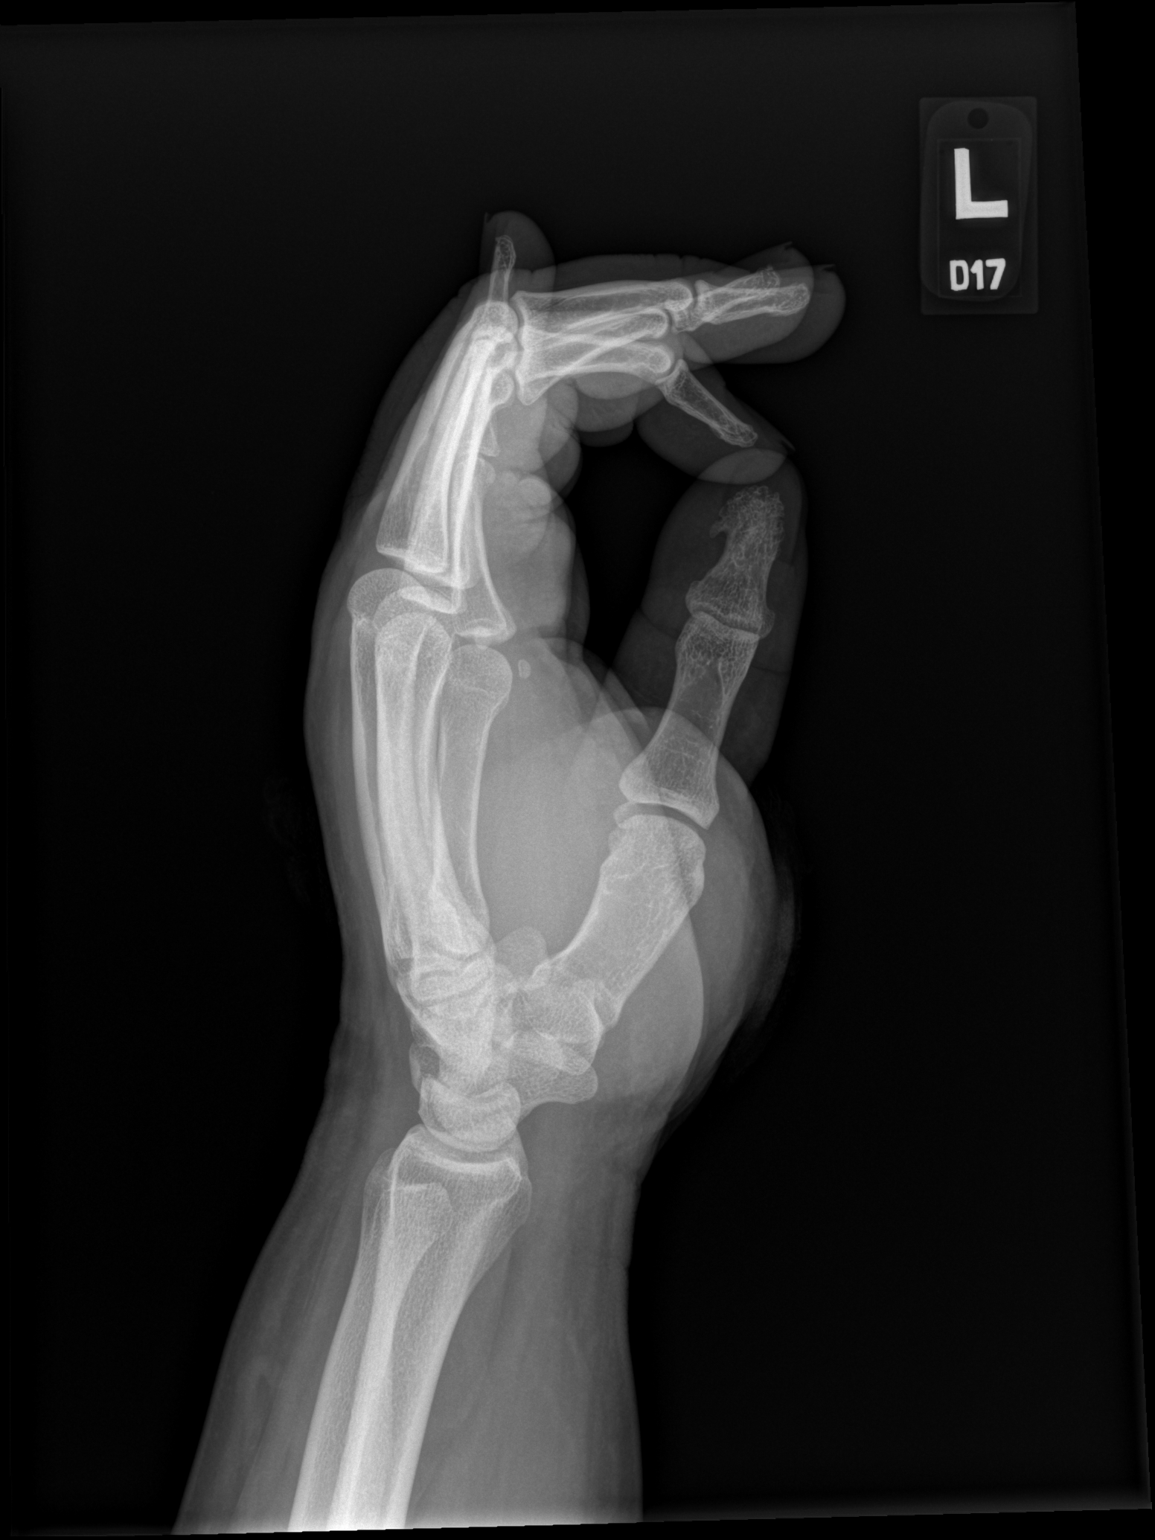

[3 of 3 positions shown; findings below may reference images not displayed]

FINDINGS: Frontal, oblique, lateral views of the left hand are obtained. No
fracture, subluxation, or dislocation. No radiopaque foreign body.
Joint spaces are relatively well preserved. Soft tissues are
unremarkable.
IMPRESSION: 1. No fracture or radiopaque foreign body.

## 2022-10-14 ENCOUNTER — Emergency Department (HOSPITAL_COMMUNITY): Payer: Medicare Other

## 2022-10-14 ENCOUNTER — Other Ambulatory Visit: Payer: Self-pay

## 2022-10-14 ENCOUNTER — Emergency Department (HOSPITAL_COMMUNITY)
Admission: EM | Admit: 2022-10-14 | Discharge: 2022-10-14 | Disposition: A | Discharge status: Home / Self Care | Payer: Medicare Other | Attending: Emergency Medicine | Admitting: Emergency Medicine

## 2022-10-14 ENCOUNTER — Encounter (HOSPITAL_COMMUNITY): Payer: Self-pay

## 2022-10-14 DIAGNOSIS — Z79899 Other long term (current) drug therapy: Secondary | ICD-10-CM | POA: Diagnosis not present

## 2022-10-14 DIAGNOSIS — W3400XA Accidental discharge from unspecified firearms or gun, initial encounter: Secondary | ICD-10-CM | POA: Insufficient documentation

## 2022-10-14 DIAGNOSIS — S81802A Unspecified open wound, left lower leg, initial encounter: Secondary | ICD-10-CM | POA: Insufficient documentation

## 2022-10-14 DIAGNOSIS — Y249XXA Unspecified firearm discharge, undetermined intent, initial encounter: Secondary | ICD-10-CM

## 2022-10-14 DIAGNOSIS — I10 Essential (primary) hypertension: Secondary | ICD-10-CM

## 2022-10-14 DIAGNOSIS — S8992XA Unspecified injury of left lower leg, initial encounter: Secondary | ICD-10-CM | POA: Diagnosis present

## 2022-10-14 MED ORDER — OXYCODONE-ACETAMINOPHEN 5-325 MG PO TABS: 1.0000 | ORAL_TABLET | Freq: Four times a day (QID) | ORAL | 0 refills | Status: DC | PRN

## 2022-10-14 MED ORDER — OXYCODONE-ACETAMINOPHEN 5-325 MG PO TABS
2.0000 | ORAL_TABLET | Freq: Once | ORAL | Status: AC
  Administered 2022-10-14: 2 via ORAL
  Filled 2022-10-14: qty 2

## 2022-10-14 NOTE — Progress Notes (Signed)
   10/14/22 0230  Spiritual Encounters  Type of Visit Initial  Care provided to: Pt not available  Referral source Nurse (RN/NT/LPN)  Reason for visit Trauma  OnCall Visit Yes   Chaplain Jorene Guest responded to the page. The patient is being attended to by the medical team. There is no support person present. [place] remains available for follow up emotional and spiritual support as needed. This note was prepared by Jeanine Luz, M.Div..  For questions please contact by phone [phone].
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
## 2022-10-14 NOTE — Progress Notes (Signed)
Orthopedic Tech Progress Note Patient Details:  Benjamin Garcia December 13, 1990 [id number]  Patient ID: Benjamin Garcia, male   DOB: 10-01-1990, 32 y.o.   MRN: [id number] I attended trauma page. [name] 10/14/2022, 3:33 AM
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
## 2022-11-07 ENCOUNTER — Other Ambulatory Visit: Payer: Self-pay

## 2022-11-07 ENCOUNTER — Inpatient Hospital Stay (HOSPITAL_COMMUNITY)
Admission: EM | Admit: 2022-11-07 | Discharge: 2022-11-09 | DRG: 603 | Disposition: A | Discharge status: Home / Self Care | Payer: Medicare Other | Attending: Internal Medicine | Admitting: Internal Medicine

## 2022-11-07 ENCOUNTER — Observation Stay (HOSPITAL_COMMUNITY): Payer: Medicare Other

## 2022-11-07 DIAGNOSIS — R7303 Prediabetes: Secondary | ICD-10-CM | POA: Diagnosis present

## 2022-11-07 DIAGNOSIS — L0390 Cellulitis, unspecified: Secondary | ICD-10-CM | POA: Diagnosis present

## 2022-11-07 DIAGNOSIS — Z79899 Other long term (current) drug therapy: Secondary | ICD-10-CM

## 2022-11-07 DIAGNOSIS — Z91148 Patient's other noncompliance with medication regimen for other reason: Secondary | ICD-10-CM

## 2022-11-07 DIAGNOSIS — I10 Essential (primary) hypertension: Secondary | ICD-10-CM | POA: Diagnosis present

## 2022-11-07 DIAGNOSIS — Z560 Unemployment, unspecified: Secondary | ICD-10-CM

## 2022-11-07 DIAGNOSIS — L03116 Cellulitis of left lower limb: Principal | ICD-10-CM | POA: Diagnosis present

## 2022-11-07 DIAGNOSIS — W3400XA Accidental discharge from unspecified firearms or gun, initial encounter: Secondary | ICD-10-CM

## 2022-11-07 DIAGNOSIS — L089 Local infection of the skin and subcutaneous tissue, unspecified: Principal | ICD-10-CM

## 2022-11-07 DIAGNOSIS — S81832S Puncture wound without foreign body, left lower leg, sequela: Secondary | ICD-10-CM | POA: Diagnosis not present

## 2022-11-07 DIAGNOSIS — Z5982 Transportation insecurity: Secondary | ICD-10-CM

## 2022-11-07 DIAGNOSIS — F1010 Alcohol abuse, uncomplicated: Secondary | ICD-10-CM | POA: Diagnosis present

## 2022-11-07 DIAGNOSIS — J45909 Unspecified asthma, uncomplicated: Secondary | ICD-10-CM | POA: Diagnosis present

## 2022-11-07 DIAGNOSIS — G4733 Obstructive sleep apnea (adult) (pediatric): Secondary | ICD-10-CM | POA: Diagnosis present

## 2022-11-07 DIAGNOSIS — F17210 Nicotine dependence, cigarettes, uncomplicated: Secondary | ICD-10-CM | POA: Diagnosis present

## 2022-11-07 DIAGNOSIS — S81832A Puncture wound without foreign body, left lower leg, initial encounter: Secondary | ICD-10-CM | POA: Diagnosis present

## 2022-11-07 DIAGNOSIS — R200 Anesthesia of skin: Secondary | ICD-10-CM | POA: Diagnosis present

## 2022-11-07 DIAGNOSIS — E669 Obesity, unspecified: Secondary | ICD-10-CM | POA: Diagnosis present

## 2022-11-07 DIAGNOSIS — R531 Weakness: Secondary | ICD-10-CM | POA: Diagnosis present

## 2022-11-07 LAB — COMPREHENSIVE METABOLIC PANEL
ALT: 26 U/L (ref 0–44)
AST: 23 U/L (ref 15–41)
Albumin: 3.7 g/dL (ref 3.5–5.0)
Alkaline Phosphatase: 101 U/L (ref 38–126)
Anion gap: 9 (ref 5–15)
BUN: 11 mg/dL (ref 6–20)
CO2: 30 mmol/L (ref 22–32)
Calcium: 9.5 mg/dL (ref 8.9–10.3)
Chloride: 98 mmol/L (ref 98–111)
Creatinine, Ser: 0.85 mg/dL (ref 0.61–1.24)
GFR, Estimated: >60 mL/min (ref >60)
Glucose, Bld: 94 mg/dL (ref 70–99)
Potassium: 4.1 mmol/L (ref 3.5–5.1)
Sodium: 137 mmol/L (ref 135–145)
Total Bilirubin: 0.3 mg/dL (ref 0.3–1.2)
Total Protein: 8.1 g/dL (ref 6.5–8.1)

## 2022-11-07 LAB — CBC WITH DIFFERENTIAL/PLATELET
Abs Immature Granulocytes: 0.04 10*3/uL (ref 0.00–0.07)
Basophils Absolute: 0 10*3/uL (ref 0.0–0.1)
Basophils Relative: 0 %
Eosinophils Absolute: 0.2 10*3/uL (ref 0.0–0.5)
Eosinophils Relative: 2 %
HCT: 47.8 % (ref 39.0–52.0)
Hemoglobin: 15 g/dL (ref 13.0–17.0)
Immature Granulocytes: 0 %
Lymphocytes Relative: 16 %
Lymphs Abs: 1.5 10*3/uL (ref 0.7–4.0)
MCH: 25.6 pg — ABNORMAL LOW (ref 26.0–34.0)
MCHC: 31.4 g/dL (ref 30.0–36.0)
MCV: 81.4 fL (ref 80.0–100.0)
Monocytes Absolute: 0.8 10*3/uL (ref 0.1–1.0)
Monocytes Relative: 9 %
Neutro Abs: 6.7 10*3/uL (ref 1.7–7.7)
Neutrophils Relative %: 73 %
Platelets: 277 10*3/uL (ref 150–400)
RBC: 5.87 MIL/uL — ABNORMAL HIGH (ref 4.22–5.81)
RDW: 16.1 % — ABNORMAL HIGH (ref 11.5–15.5)
WBC: 9.3 10*3/uL (ref 4.0–10.5)
nRBC: 0 % (ref 0.0–0.2)

## 2022-11-07 LAB — HIV ANTIBODY (ROUTINE TESTING W REFLEX): HIV Screen 4th Generation wRfx: NONREACTIVE

## 2022-11-07 LAB — LACTIC ACID, PLASMA
Lactic Acid, Venous: 1.4 mmol/L (ref 0.5–1.9)
Lactic Acid, Venous: 1.8 mmol/L (ref 0.5–1.9)
Lactic Acid, Venous: 2.3 mmol/L (ref 0.5–1.9)
Lactic Acid, Venous: 2.3 mmol/L (ref 0.5–1.9)

## 2022-11-07 MED ORDER — LABETALOL HCL 5 MG/ML IV SOLN: 5.0000 mg | INTRAVENOUS | Status: DC | PRN

## 2022-11-07 MED ORDER — FOLIC ACID 1 MG PO TABS
1.0000 mg | ORAL_TABLET | Freq: Every day | ORAL | Status: DC
  Administered 2022-11-07 – 2022-11-09 (×3): 1 mg via ORAL
  Filled 2022-11-07 (×3): qty 1

## 2022-11-07 MED ORDER — ADULT MULTIVITAMIN W/MINERALS CH
1.0000 | ORAL_TABLET | Freq: Every day | ORAL | Status: DC
  Administered 2022-11-07 – 2022-11-09 (×3): 1 via ORAL
  Filled 2022-11-07 (×3): qty 1

## 2022-11-07 MED ORDER — SILVER SULFADIAZINE 1 % EX CREA
TOPICAL_CREAM | Freq: Every day | CUTANEOUS | Status: DC
  Filled 2022-11-07: qty 85

## 2022-11-07 MED ORDER — VANCOMYCIN HCL 2000 MG/400ML IV SOLN
2000.0000 mg | Freq: Two times a day (BID) | INTRAVENOUS | Status: DC
  Administered 2022-11-08: 2000 mg via INTRAVENOUS
  Filled 2022-11-07 (×2): qty 400

## 2022-11-07 MED ORDER — ACETAMINOPHEN 325 MG PO TABS
650.0000 mg | ORAL_TABLET | Freq: Four times a day (QID) | ORAL | Status: DC | PRN
  Administered 2022-11-07: 650 mg via ORAL
  Filled 2022-11-07: qty 2

## 2022-11-07 MED ORDER — AMLODIPINE BESYLATE 5 MG PO TABS
5.0000 mg | ORAL_TABLET | Freq: Every day | ORAL | Status: DC
  Administered 2022-11-08: 5 mg via ORAL
  Filled 2022-11-07 (×2): qty 1

## 2022-11-07 MED ORDER — VANCOMYCIN HCL 10 G IV SOLR
2500.0000 mg | Freq: Once | INTRAVENOUS | Status: AC
  Administered 2022-11-07: 2500 mg via INTRAVENOUS
  Filled 2022-11-07: qty 2500

## 2022-11-07 MED ORDER — LORAZEPAM 1 MG PO TABS
1.0000 mg | ORAL_TABLET | ORAL | Status: DC | PRN
  Administered 2022-11-08: 1 mg via ORAL
  Filled 2022-11-07: qty 1

## 2022-11-07 MED ORDER — HYDROMORPHONE HCL 1 MG/ML IJ SOLN
1.0000 mg | INTRAMUSCULAR | Status: DC | PRN
  Administered 2022-11-07 – 2022-11-08 (×3): 1 mg via INTRAVENOUS
  Filled 2022-11-07 (×3): qty 1

## 2022-11-07 MED ORDER — AMLODIPINE BESYLATE 5 MG PO TABS
5.0000 mg | ORAL_TABLET | Freq: Every day | ORAL | Status: DC
  Administered 2022-11-07: 5 mg via ORAL
  Filled 2022-11-07: qty 1

## 2022-11-07 MED ORDER — AMLODIPINE BESYLATE 5 MG PO TABS
5.0000 mg | ORAL_TABLET | Freq: Once | ORAL | Status: AC
  Administered 2022-11-07: 5 mg via ORAL
  Filled 2022-11-07: qty 1

## 2022-11-07 MED ORDER — THIAMINE MONONITRATE 100 MG PO TABS
100.0000 mg | ORAL_TABLET | Freq: Every day | ORAL | Status: DC
  Administered 2022-11-08 – 2022-11-09 (×2): 100 mg via ORAL
  Filled 2022-11-07 (×3): qty 1

## 2022-11-07 MED ORDER — LACTATED RINGERS IV BOLUS
1000.0000 mL | Freq: Once | INTRAVENOUS | Status: AC
  Administered 2022-11-07: 1000 mL via INTRAVENOUS

## 2022-11-07 MED ORDER — CHLORDIAZEPOXIDE HCL 25 MG PO CAPS
25.0000 mg | ORAL_CAPSULE | Freq: Once | ORAL | Status: AC
  Administered 2022-11-07: 25 mg via ORAL
  Filled 2022-11-07: qty 1

## 2022-11-07 MED ORDER — THIAMINE HCL 100 MG/ML IJ SOLN
100.0000 mg | Freq: Every day | INTRAMUSCULAR | Status: DC
  Administered 2022-11-07: 100 mg via INTRAVENOUS
  Filled 2022-11-07: qty 2

## 2022-11-07 MED ORDER — METRONIDAZOLE 500 MG/100ML IV SOLN
500.0000 mg | Freq: Two times a day (BID) | INTRAVENOUS | Status: DC
  Administered 2022-11-07 – 2022-11-08 (×2): 500 mg via INTRAVENOUS
  Filled 2022-11-07 (×2): qty 100

## 2022-11-07 MED ORDER — CHLORDIAZEPOXIDE HCL 25 MG PO CAPS: 25.0000 mg | ORAL_CAPSULE | Freq: Every day | ORAL | Status: DC

## 2022-11-07 MED ORDER — VANCOMYCIN HCL IN DEXTROSE 1-5 GM/200ML-% IV SOLN
1000.0000 mg | Freq: Once | INTRAVENOUS | Status: DC
  Filled 2022-11-07: qty 200

## 2022-11-07 MED ORDER — CHLORDIAZEPOXIDE HCL 25 MG PO CAPS: 25.0000 mg | ORAL_CAPSULE | ORAL | Status: DC

## 2022-11-07 MED ORDER — ACETAMINOPHEN 650 MG RE SUPP: 650.0000 mg | Freq: Four times a day (QID) | RECTAL | Status: DC | PRN

## 2022-11-07 MED ORDER — IOHEXOL 350 MG/ML SOLN
75.0000 mL | Freq: Once | INTRAVENOUS | Status: AC | PRN
  Administered 2022-11-07: 75 mL via INTRAVENOUS

## 2022-11-07 MED ORDER — MORPHINE SULFATE (PF) 2 MG/ML IV SOLN
2.0000 mg | INTRAVENOUS | Status: DC | PRN
  Administered 2022-11-07 – 2022-11-08 (×3): 2 mg via INTRAVENOUS
  Filled 2022-11-07 (×3): qty 1

## 2022-11-07 MED ORDER — SODIUM CHLORIDE 0.9 % IV BOLUS
1000.0000 mL | Freq: Once | INTRAVENOUS | Status: AC
  Administered 2022-11-07: 1000 mL via INTRAVENOUS

## 2022-11-07 MED ORDER — CHLORDIAZEPOXIDE HCL 25 MG PO CAPS
25.0000 mg | ORAL_CAPSULE | Freq: Four times a day (QID) | ORAL | Status: AC
  Administered 2022-11-08 (×4): 25 mg via ORAL
  Filled 2022-11-07 (×4): qty 1

## 2022-11-07 MED ORDER — AMLODIPINE BESYLATE 10 MG PO TABS: 10.0000 mg | ORAL_TABLET | Freq: Every day | ORAL | Status: DC

## 2022-11-07 MED ORDER — LISINOPRIL 20 MG PO TABS
20.0000 mg | ORAL_TABLET | Freq: Every day | ORAL | Status: DC
  Administered 2022-11-07 – 2022-11-08 (×2): 20 mg via ORAL
  Filled 2022-11-07 (×3): qty 1

## 2022-11-07 MED ORDER — SODIUM CHLORIDE 0.9 % IV SOLN
1.0000 g | Freq: Once | INTRAVENOUS | Status: DC
  Filled 2022-11-07: qty 10

## 2022-11-07 MED ORDER — SODIUM CHLORIDE 0.9 % IV SOLN
2.0000 g | INTRAVENOUS | Status: DC
  Administered 2022-11-07: 2 g via INTRAVENOUS
  Filled 2022-11-07: qty 20

## 2022-11-07 MED ORDER — HEPARIN SODIUM (PORCINE) 5000 UNIT/ML IJ SOLN
5000.0000 [IU] | Freq: Three times a day (TID) | INTRAMUSCULAR | Status: DC
  Administered 2022-11-07 – 2022-11-09 (×7): 5000 [IU] via SUBCUTANEOUS
  Filled 2022-11-07 (×7): qty 1

## 2022-11-07 MED ORDER — CHLORDIAZEPOXIDE HCL 25 MG PO CAPS
25.0000 mg | ORAL_CAPSULE | Freq: Three times a day (TID) | ORAL | Status: DC
  Administered 2022-11-09: 25 mg via ORAL
  Filled 2022-11-07: qty 1
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
## 2022-11-07 NOTE — ED Notes (Signed)
Pt's blood pressure 169/123 at this time, message sent to Dr Elliot Gurney for IV blood pressure medication, waiting response.
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
## 2022-11-07 NOTE — ED Notes (Signed)
MD notified of multiple unsuccessful attempts for lab draw. Informed to proceed with IV Abx.

## 2022-11-07 NOTE — ED Notes (Signed)
Pt's blood pressure noted 171/121, message sent to Dr Heber [place] and team. Waiting response.

## 2022-11-07 NOTE — ED Notes (Signed)
Dr Elliot Gurney aware of pt's current blood pressure 167/113 at this time and was given amlodipine as ordered. Pt also given prn morphine for pain. No new orders at this time.
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
## 2022-11-07 NOTE — ED Notes (Signed)
Unable to collect labs 

## 2022-11-08 DIAGNOSIS — S81832A Puncture wound without foreign body, left lower leg, initial encounter: Secondary | ICD-10-CM | POA: Diagnosis present

## 2022-11-08 DIAGNOSIS — J9601 Acute respiratory failure with hypoxia: Secondary | ICD-10-CM | POA: Diagnosis not present

## 2022-11-08 DIAGNOSIS — W3400XD Accidental discharge from unspecified firearms or gun, subsequent encounter: Secondary | ICD-10-CM | POA: Diagnosis not present

## 2022-11-08 DIAGNOSIS — J45909 Unspecified asthma, uncomplicated: Secondary | ICD-10-CM | POA: Diagnosis present

## 2022-11-08 DIAGNOSIS — Z91148 Patient's other noncompliance with medication regimen for other reason: Secondary | ICD-10-CM | POA: Diagnosis not present

## 2022-11-08 DIAGNOSIS — I10 Essential (primary) hypertension: Secondary | ICD-10-CM | POA: Diagnosis present

## 2022-11-08 DIAGNOSIS — R7303 Prediabetes: Secondary | ICD-10-CM | POA: Diagnosis present

## 2022-11-08 DIAGNOSIS — R531 Weakness: Secondary | ICD-10-CM | POA: Diagnosis present

## 2022-11-08 DIAGNOSIS — G4733 Obstructive sleep apnea (adult) (pediatric): Secondary | ICD-10-CM | POA: Diagnosis present

## 2022-11-08 DIAGNOSIS — J9602 Acute respiratory failure with hypercapnia: Secondary | ICD-10-CM | POA: Diagnosis not present

## 2022-11-08 DIAGNOSIS — R200 Anesthesia of skin: Secondary | ICD-10-CM | POA: Diagnosis present

## 2022-11-08 DIAGNOSIS — E669 Obesity, unspecified: Secondary | ICD-10-CM | POA: Diagnosis present

## 2022-11-08 DIAGNOSIS — W3400XA Accidental discharge from unspecified firearms or gun, initial encounter: Secondary | ICD-10-CM | POA: Diagnosis not present

## 2022-11-08 DIAGNOSIS — J9621 Acute and chronic respiratory failure with hypoxia: Secondary | ICD-10-CM | POA: Diagnosis not present

## 2022-11-08 DIAGNOSIS — F17210 Nicotine dependence, cigarettes, uncomplicated: Secondary | ICD-10-CM | POA: Diagnosis present

## 2022-11-08 DIAGNOSIS — L03116 Cellulitis of left lower limb: Secondary | ICD-10-CM | POA: Diagnosis present

## 2022-11-08 DIAGNOSIS — J189 Pneumonia, unspecified organism: Secondary | ICD-10-CM | POA: Diagnosis not present

## 2022-11-08 DIAGNOSIS — L089 Local infection of the skin and subcutaneous tissue, unspecified: Secondary | ICD-10-CM | POA: Diagnosis present

## 2022-11-08 DIAGNOSIS — Z79899 Other long term (current) drug therapy: Secondary | ICD-10-CM | POA: Diagnosis not present

## 2022-11-08 DIAGNOSIS — Z560 Unemployment, unspecified: Secondary | ICD-10-CM | POA: Diagnosis not present

## 2022-11-08 DIAGNOSIS — Z5982 Transportation insecurity: Secondary | ICD-10-CM | POA: Diagnosis not present

## 2022-11-08 DIAGNOSIS — F1010 Alcohol abuse, uncomplicated: Secondary | ICD-10-CM | POA: Diagnosis present

## 2022-11-08 LAB — BASIC METABOLIC PANEL
Anion gap: 11 (ref 5–15)
BUN: 7 mg/dL (ref 6–20)
CO2: 26 mmol/L (ref 22–32)
Calcium: 8.9 mg/dL (ref 8.9–10.3)
Chloride: 95 mmol/L — ABNORMAL LOW (ref 98–111)
Creatinine, Ser: 0.77 mg/dL (ref 0.61–1.24)
GFR, Estimated: >60 mL/min (ref >60)
Glucose, Bld: 100 mg/dL — ABNORMAL HIGH (ref 70–99)
Potassium: 3.8 mmol/L (ref 3.5–5.1)
Sodium: 132 mmol/L — ABNORMAL LOW (ref 135–145)

## 2022-11-08 LAB — CBC
HCT: 43.1 % (ref 39.0–52.0)
Hemoglobin: 13.5 g/dL (ref 13.0–17.0)
MCH: 25.6 pg — ABNORMAL LOW (ref 26.0–34.0)
MCHC: 31.3 g/dL (ref 30.0–36.0)
MCV: 81.6 fL (ref 80.0–100.0)
Platelets: 220 10*3/uL (ref 150–400)
RBC: 5.28 MIL/uL (ref 4.22–5.81)
RDW: 16 % — ABNORMAL HIGH (ref 11.5–15.5)
WBC: 12.4 10*3/uL — ABNORMAL HIGH (ref 4.0–10.5)
nRBC: 0 % (ref 0.0–0.2)

## 2022-11-08 LAB — GLUCOSE, CAPILLARY
Glucose-Capillary: 93 mg/dL (ref 70–99)
Glucose-Capillary: 209 mg/dL — ABNORMAL HIGH (ref 70–99)
Glucose-Capillary: 114 mg/dL — ABNORMAL HIGH (ref 70–99)

## 2022-11-08 LAB — HEMOGLOBIN A1C
Hgb A1c MFr Bld: 6 % — ABNORMAL HIGH (ref 4.8–5.6)
Mean Plasma Glucose: 126 mg/dL

## 2022-11-08 LAB — LACTIC ACID, PLASMA: Lactic Acid, Venous: 1.7 mmol/L (ref 0.5–1.9)

## 2022-11-08 MED ORDER — SILVER SULFADIAZINE 1 % EX CREA
TOPICAL_CREAM | Freq: Every day | CUTANEOUS | Status: DC
  Filled 2022-11-08: qty 85

## 2022-11-08 MED ORDER — ACETAMINOPHEN 650 MG RE SUPP: 650.0000 mg | Freq: Three times a day (TID) | RECTAL | Status: DC

## 2022-11-08 MED ORDER — MORPHINE SULFATE (PF) 2 MG/ML IV SOLN: 1.0000 mg | INTRAVENOUS | Status: DC | PRN

## 2022-11-08 MED ORDER — AMOXICILLIN-POT CLAVULANATE 875-125 MG PO TABS
1.0000 | ORAL_TABLET | Freq: Two times a day (BID) | ORAL | Status: DC
  Administered 2022-11-08 – 2022-11-09 (×3): 1 via ORAL
  Filled 2022-11-08 (×3): qty 1

## 2022-11-08 MED ORDER — HYDROMORPHONE HCL 1 MG/ML IJ SOLN: 1.0000 mg | INTRAMUSCULAR | Status: DC | PRN

## 2022-11-08 MED ORDER — DOXYCYCLINE HYCLATE 100 MG PO TABS
100.0000 mg | ORAL_TABLET | Freq: Two times a day (BID) | ORAL | Status: DC
  Administered 2022-11-08 – 2022-11-09 (×3): 100 mg via ORAL
  Filled 2022-11-08 (×3): qty 1

## 2022-11-08 MED ORDER — ACETAMINOPHEN 500 MG PO TABS
1000.0000 mg | ORAL_TABLET | Freq: Three times a day (TID) | ORAL | Status: DC
  Administered 2022-11-08 – 2022-11-09 (×4): 1000 mg via ORAL
  Filled 2022-11-08 (×4): qty 2

## 2022-11-08 NOTE — Plan of Care (Signed)

## 2022-11-08 NOTE — Progress Notes (Signed)
Patient is alert, however kept on going back to sleep with pauses/apnea. MD made aware, Tele and Continuous pulse ox ordered, and more orders appropriate for the patient.

## 2022-11-08 NOTE — Progress Notes (Signed)
Patient was tried on NIV for the night. Patient states "can not breathe with this mask". Positive reinforcement was given and settings were adjusted to aid in better comfort while also meeting patient systemic oxygen demands via utilization of NIV. Patient continues to rip the mask off and refuse NIV. RN is aware of refusal. Will monitor patient as needed. Patient is clinically stable at this time. Saturations are stable on 4L [place].

## 2022-11-08 NOTE — Plan of Care (Signed)
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
## 2022-11-08 NOTE — Progress Notes (Signed)
Patient ID: Benjamin Garcia, male   DOB: 1991/06/13, 32 y.o.   MRN: [id number] I did speak to my partner Dr. Sharol Given about this patient.  He has seen the patient's leg wound photos in the media section of epic.  He has recommended Silvadene be placed on the wound daily followed by dry dressing.  He would like to see the patient in his office as an outpatient in another week.  This is a chronic wound that would potentially benefit from a biologic skin graft at some point.  There is not a need for an urgent surgical intervention during this hospitalization.  If the patient is here Monday, the PT wound service can be consulted for 1 hydrotherapy session to that left leg wound again followed by Silvadene daily and a dry dressing.  The patient can go home with that type of routine in terms of Silvadene daily on the wound followed by dry dressing and again follow-up with Dr. Sharol Given in a week.
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
## 2022-11-09 LAB — BASIC METABOLIC PANEL
Anion gap: 8 (ref 5–15)
BUN: 8 mg/dL (ref 6–20)
CO2: 27 mmol/L (ref 22–32)
Calcium: 8.8 mg/dL — ABNORMAL LOW (ref 8.9–10.3)
Chloride: 99 mmol/L (ref 98–111)
Creatinine, Ser: 0.75 mg/dL (ref 0.61–1.24)
GFR, Estimated: >60 mL/min (ref >60)
Glucose, Bld: 92 mg/dL (ref 70–99)
Potassium: 3.7 mmol/L (ref 3.5–5.1)
Sodium: 134 mmol/L — ABNORMAL LOW (ref 135–145)

## 2022-11-09 LAB — CBC
HCT: 42.4 % (ref 39.0–52.0)
Hemoglobin: 13.4 g/dL (ref 13.0–17.0)
MCH: 25.8 pg — ABNORMAL LOW (ref 26.0–34.0)
MCHC: 31.6 g/dL (ref 30.0–36.0)
MCV: 81.7 fL (ref 80.0–100.0)
Platelets: 226 10*3/uL (ref 150–400)
RBC: 5.19 MIL/uL (ref 4.22–5.81)
RDW: 15.9 % — ABNORMAL HIGH (ref 11.5–15.5)
WBC: 10.4 10*3/uL (ref 4.0–10.5)
nRBC: 0 % (ref 0.0–0.2)

## 2022-11-09 LAB — BLOOD GAS, VENOUS
Acid-Base Excess: 4.9 mmol/L — ABNORMAL HIGH (ref 0.0–2.0)
Bicarbonate: 33 mmol/L — ABNORMAL HIGH (ref 20.0–28.0)
Drawn by: 42628
O2 Saturation: 58.9 %
Patient temperature: 36.9
pCO2, Ven: 64 mmHg — ABNORMAL HIGH (ref 44–60)
pH, Ven: 7.32 (ref 7.25–7.43)
pO2, Ven: 34 mmHg (ref 32–45)

## 2022-11-09 MED ORDER — VARENICLINE TARTRATE 0.5 MG PO TABS: ORAL_TABLET | ORAL | 0 refills | Status: AC

## 2022-11-09 MED ORDER — VITAMIN B-1 100 MG PO TABS: 100.0000 mg | ORAL_TABLET | Freq: Every day | ORAL | 3 refills | Status: AC

## 2022-11-09 MED ORDER — SILVER SULFADIAZINE 1 % EX CREA: TOPICAL_CREAM | Freq: Every day | CUTANEOUS | 0 refills | Status: AC

## 2022-11-09 MED ORDER — FOLIC ACID 1 MG PO TABS: 1.0000 mg | ORAL_TABLET | Freq: Every day | ORAL | 0 refills | Status: AC

## 2022-11-09 MED ORDER — CHLORDIAZEPOXIDE HCL 25 MG PO CAPS: 25.0000 mg | ORAL_CAPSULE | Freq: Once | ORAL | Status: DC

## 2022-11-09 MED ORDER — LISINOPRIL 20 MG PO TABS: 20.0000 mg | ORAL_TABLET | Freq: Every day | ORAL | 3 refills | Status: AC

## 2022-11-09 MED ORDER — NICOTINE 7 MG/24HR TD PT24: 7.0000 mg | MEDICATED_PATCH | TRANSDERMAL | 0 refills | Status: AC

## 2022-11-09 MED ORDER — OXYCODONE HCL 5 MG PO TABS: 5.0000 mg | ORAL_TABLET | Freq: Four times a day (QID) | ORAL | 0 refills | Status: AC | PRN

## 2022-11-09 MED ORDER — CHLORDIAZEPOXIDE HCL 25 MG PO CAPS: 25.0000 mg | ORAL_CAPSULE | Freq: Every day | ORAL | 0 refills | Status: AC

## 2022-11-09 MED ORDER — OXYCODONE HCL 5 MG PO TABS: 5.0000 mg | ORAL_TABLET | Freq: Four times a day (QID) | ORAL | Status: DC | PRN

## 2022-11-09 MED ORDER — AMOXICILLIN-POT CLAVULANATE 875-125 MG PO TABS: 1.0000 | ORAL_TABLET | Freq: Two times a day (BID) | ORAL | 0 refills | Status: AC

## 2022-11-09 MED ORDER — DOXYCYCLINE HYCLATE 100 MG PO TABS: 100.0000 mg | ORAL_TABLET | Freq: Two times a day (BID) | ORAL | 0 refills | Status: AC

## 2022-11-09 MED ORDER — ADULT MULTIVITAMIN W/MINERALS CH: 1.0000 | ORAL_TABLET | Freq: Every day | ORAL | 0 refills | Status: AC

## 2022-11-09 MED ORDER — HYDROMORPHONE HCL 1 MG/ML IJ SOLN
0.5000 mg | Freq: Once | INTRAMUSCULAR | Status: AC
  Administered 2022-11-09: 0.5 mg via INTRAVENOUS
  Filled 2022-11-09: qty 0.5

## 2022-11-09 MED ORDER — CHLORDIAZEPOXIDE HCL 25 MG PO CAPS: 25.0000 mg | ORAL_CAPSULE | Freq: Every day | ORAL | 0 refills | Status: DC

## 2022-11-09 NOTE — TOC Transition Note (Signed)
Transition of Care (TOC) - CM/SW Discharge Note Marvetta Gibbons RN, BSN Transitions of Care Unit 4E- RN Case Manager See Treatment Team for direct phone # Weekend cross coverage 5N  Patient Details  Name: Benjamin Garcia MRN: [id number] Date of Birth: 05-05-1991  Transition of Care [facility]) CM/SW Contact:  Dawayne Patricia, RN Phone Number: 11/09/2022, 5:07 PM   Clinical Narrative:    Notified by MD that pt would be discharged this evening and needed transportation home.  CM went to room to speak with pt- pt voiced he has no one to provide transportation home, can not afford his own transport or take the bus.  TOC will assist with transportation home via taxi voucher- address in epic confirmed with pt.   Pt signed ride waiver. And Taxi voucher provided to bedside RN to have when discharge placed by attending team.    Final next level of care: Home/Self Care Barriers to Discharge: No Barriers Identified   Patient Goals and CMS Choice CMS Medicare.gov Compare Post Acute Care list provided to:: Patient Choice offered to / list presented to : NA  Discharge Placement                 Home        Discharge Plan and Services Additional resources added to the After Visit Summary for                  DME Arranged: N/A DME Agency: NA       HH Arranged: NA HH Agency: NA        Social Determinants of Health (SDOH) Interventions SDOH Screenings   Food Insecurity: Patient Declined (11/08/2022)  Transportation Needs: No Transportation Needs (11/08/2022)  Tobacco Use: High Risk (10/14/2022)     Readmission Risk Interventions    11/09/2022    5:07 PM  Readmission Risk Prevention Plan  Medication Screening Complete  Transportation Screening Complete
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
## 2022-11-09 NOTE — Progress Notes (Signed)
Patient Ambulated 200 Feet on room air.  Beginning SaO2 was 91%.  End SaO2 was 94%.  During ambulation SaO2 ranged from 89 to 94 %
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
## 2022-11-10 ENCOUNTER — Encounter (HOSPITAL_COMMUNITY): Payer: Self-pay | Admitting: Emergency Medicine

## 2022-11-10 ENCOUNTER — Emergency Department (HOSPITAL_COMMUNITY): Payer: Medicare Other

## 2022-11-10 ENCOUNTER — Inpatient Hospital Stay (HOSPITAL_COMMUNITY): Payer: Medicare Other

## 2022-11-10 ENCOUNTER — Inpatient Hospital Stay (HOSPITAL_COMMUNITY)
Admission: EM | Admit: 2022-11-10 | Discharge: 2022-11-16 | DRG: 871 | Discharge status: Against Medical Advice | Payer: Medicare Other | Attending: Internal Medicine | Admitting: Internal Medicine

## 2022-11-10 ENCOUNTER — Other Ambulatory Visit: Payer: Self-pay

## 2022-11-10 DIAGNOSIS — Z6841 Body Mass Index (BMI) 40.0 and over, adult: Secondary | ICD-10-CM

## 2022-11-10 DIAGNOSIS — J690 Pneumonitis due to inhalation of food and vomit: Secondary | ICD-10-CM | POA: Diagnosis present

## 2022-11-10 DIAGNOSIS — J9622 Acute and chronic respiratory failure with hypercapnia: Secondary | ICD-10-CM | POA: Diagnosis present

## 2022-11-10 DIAGNOSIS — Y95 Nosocomial condition: Secondary | ICD-10-CM | POA: Diagnosis present

## 2022-11-10 DIAGNOSIS — J189 Pneumonia, unspecified organism: Secondary | ICD-10-CM | POA: Diagnosis present

## 2022-11-10 DIAGNOSIS — W3400XS Accidental discharge from unspecified firearms or gun, sequela: Secondary | ICD-10-CM

## 2022-11-10 DIAGNOSIS — L03116 Cellulitis of left lower limb: Secondary | ICD-10-CM | POA: Diagnosis present

## 2022-11-10 DIAGNOSIS — J9621 Acute and chronic respiratory failure with hypoxia: Secondary | ICD-10-CM | POA: Diagnosis present

## 2022-11-10 DIAGNOSIS — Z79899 Other long term (current) drug therapy: Secondary | ICD-10-CM

## 2022-11-10 DIAGNOSIS — M545 Low back pain, unspecified: Secondary | ICD-10-CM | POA: Diagnosis present

## 2022-11-10 DIAGNOSIS — E669 Obesity, unspecified: Secondary | ICD-10-CM | POA: Diagnosis present

## 2022-11-10 DIAGNOSIS — R0602 Shortness of breath: Secondary | ICD-10-CM | POA: Diagnosis not present

## 2022-11-10 DIAGNOSIS — R6520 Severe sepsis without septic shock: Secondary | ICD-10-CM | POA: Diagnosis present

## 2022-11-10 DIAGNOSIS — G4700 Insomnia, unspecified: Secondary | ICD-10-CM | POA: Diagnosis not present

## 2022-11-10 DIAGNOSIS — N179 Acute kidney failure, unspecified: Secondary | ICD-10-CM | POA: Diagnosis not present

## 2022-11-10 DIAGNOSIS — Z5329 Procedure and treatment not carried out because of patient's decision for other reasons: Secondary | ICD-10-CM | POA: Diagnosis present

## 2022-11-10 DIAGNOSIS — F1010 Alcohol abuse, uncomplicated: Secondary | ICD-10-CM | POA: Diagnosis present

## 2022-11-10 DIAGNOSIS — J45909 Unspecified asthma, uncomplicated: Secondary | ICD-10-CM | POA: Diagnosis present

## 2022-11-10 DIAGNOSIS — B9620 Unspecified Escherichia coli [E. coli] as the cause of diseases classified elsewhere: Secondary | ICD-10-CM | POA: Diagnosis present

## 2022-11-10 DIAGNOSIS — G4733 Obstructive sleep apnea (adult) (pediatric): Secondary | ICD-10-CM | POA: Diagnosis present

## 2022-11-10 DIAGNOSIS — F17210 Nicotine dependence, cigarettes, uncomplicated: Secondary | ICD-10-CM | POA: Diagnosis present

## 2022-11-10 DIAGNOSIS — M79662 Pain in left lower leg: Secondary | ICD-10-CM | POA: Diagnosis present

## 2022-11-10 DIAGNOSIS — L089 Local infection of the skin and subcutaneous tissue, unspecified: Secondary | ICD-10-CM

## 2022-11-10 DIAGNOSIS — Z5986 Financial insecurity: Secondary | ICD-10-CM | POA: Diagnosis not present

## 2022-11-10 DIAGNOSIS — A419 Sepsis, unspecified organism: Secondary | ICD-10-CM | POA: Diagnosis present

## 2022-11-10 DIAGNOSIS — S81832S Puncture wound without foreign body, left lower leg, sequela: Secondary | ICD-10-CM

## 2022-11-10 DIAGNOSIS — Z1152 Encounter for screening for COVID-19: Secondary | ICD-10-CM

## 2022-11-10 DIAGNOSIS — G9341 Metabolic encephalopathy: Secondary | ICD-10-CM | POA: Diagnosis present

## 2022-11-10 DIAGNOSIS — I959 Hypotension, unspecified: Secondary | ICD-10-CM | POA: Diagnosis not present

## 2022-11-10 DIAGNOSIS — Z22322 Carrier or suspected carrier of Methicillin resistant Staphylococcus aureus: Secondary | ICD-10-CM | POA: Diagnosis not present

## 2022-11-10 DIAGNOSIS — R7303 Prediabetes: Secondary | ICD-10-CM | POA: Diagnosis present

## 2022-11-10 DIAGNOSIS — I10 Essential (primary) hypertension: Secondary | ICD-10-CM | POA: Diagnosis present

## 2022-11-10 DIAGNOSIS — E876 Hypokalemia: Secondary | ICD-10-CM | POA: Diagnosis not present

## 2022-11-10 DIAGNOSIS — J9602 Acute respiratory failure with hypercapnia: Secondary | ICD-10-CM | POA: Diagnosis not present

## 2022-11-10 DIAGNOSIS — J9601 Acute respiratory failure with hypoxia: Secondary | ICD-10-CM

## 2022-11-10 LAB — COMPREHENSIVE METABOLIC PANEL
ALT: 28 U/L (ref 0–44)
AST: 34 U/L (ref 15–41)
Albumin: 3.1 g/dL — ABNORMAL LOW (ref 3.5–5.0)
Alkaline Phosphatase: 83 U/L (ref 38–126)
Anion gap: 10 (ref 5–15)
BUN: 8 mg/dL (ref 6–20)
CO2: 26 mmol/L (ref 22–32)
Calcium: 9.1 mg/dL (ref 8.9–10.3)
Chloride: 98 mmol/L (ref 98–111)
Creatinine, Ser: 0.8 mg/dL (ref 0.61–1.24)
GFR, Estimated: >60 mL/min (ref >60)
Glucose, Bld: 109 mg/dL — ABNORMAL HIGH (ref 70–99)
Potassium: 3.9 mmol/L (ref 3.5–5.1)
Sodium: 134 mmol/L — ABNORMAL LOW (ref 135–145)
Total Bilirubin: 0.2 mg/dL — ABNORMAL LOW (ref 0.3–1.2)
Total Protein: 7.6 g/dL (ref 6.5–8.1)

## 2022-11-10 LAB — CBC WITH DIFFERENTIAL/PLATELET
Abs Immature Granulocytes: 0.04 10*3/uL (ref 0.00–0.07)
Basophils Absolute: 0 10*3/uL (ref 0.0–0.1)
Basophils Relative: 0 %
Eosinophils Absolute: 0.3 10*3/uL (ref 0.0–0.5)
Eosinophils Relative: 3 %
HCT: 40.9 % (ref 39.0–52.0)
Hemoglobin: 12.7 g/dL — ABNORMAL LOW (ref 13.0–17.0)
Immature Granulocytes: 0 %
Lymphocytes Relative: 11 %
Lymphs Abs: 1.1 10*3/uL (ref 0.7–4.0)
MCH: 25.8 pg — ABNORMAL LOW (ref 26.0–34.0)
MCHC: 31.1 g/dL (ref 30.0–36.0)
MCV: 83 fL (ref 80.0–100.0)
Monocytes Absolute: 0.8 10*3/uL (ref 0.1–1.0)
Monocytes Relative: 8 %
Neutro Abs: 7.7 10*3/uL (ref 1.7–7.7)
Neutrophils Relative %: 78 %
Platelets: 211 10*3/uL (ref 150–400)
RBC: 4.93 MIL/uL (ref 4.22–5.81)
RDW: 15.8 % — ABNORMAL HIGH (ref 11.5–15.5)
WBC: 10 10*3/uL (ref 4.0–10.5)
nRBC: 0 % (ref 0.0–0.2)

## 2022-11-10 LAB — CBC
HCT: 40.8 % (ref 39.0–52.0)
Hemoglobin: 12.5 g/dL — ABNORMAL LOW (ref 13.0–17.0)
MCH: 25.6 pg — ABNORMAL LOW (ref 26.0–34.0)
MCHC: 30.6 g/dL (ref 30.0–36.0)
MCV: 83.4 fL (ref 80.0–100.0)
Platelets: 218 10*3/uL (ref 150–400)
RBC: 4.89 MIL/uL (ref 4.22–5.81)
RDW: 15.9 % — ABNORMAL HIGH (ref 11.5–15.5)
WBC: 9.6 10*3/uL (ref 4.0–10.5)
nRBC: 0 % (ref 0.0–0.2)

## 2022-11-10 LAB — POCT I-STAT 7, (LYTES, BLD GAS, ICA,H+H)
Acid-Base Excess: 5 mmol/L — ABNORMAL HIGH (ref 0.0–2.0)
Bicarbonate: 30.4 mmol/L — ABNORMAL HIGH (ref 20.0–28.0)
Calcium, Ion: 1.16 mmol/L (ref 1.15–1.40)
HCT: 38 % — ABNORMAL LOW (ref 39.0–52.0)
Hemoglobin: 12.9 g/dL — ABNORMAL LOW (ref 13.0–17.0)
O2 Saturation: 94 %
Patient temperature: 100.9
Potassium: 3.8 mmol/L (ref 3.5–5.1)
Sodium: 133 mmol/L — ABNORMAL LOW (ref 135–145)
TCO2: 32 mmol/L (ref 22–32)
pCO2 arterial: 48.8 mmHg — ABNORMAL HIGH (ref 32–48)
pH, Arterial: 7.407 (ref 7.35–7.45)
pO2, Arterial: 75 mmHg — ABNORMAL LOW (ref 83–108)

## 2022-11-10 LAB — BLOOD GAS, VENOUS
Acid-Base Excess: 9.1 mmol/L — ABNORMAL HIGH (ref 0.0–2.0)
Bicarbonate: 36.3 mmol/L — ABNORMAL HIGH (ref 20.0–28.0)
Drawn by: 1470
O2 Saturation: 64.1 %
Patient temperature: 38.3
pCO2, Ven: 64 mmHg — ABNORMAL HIGH (ref 44–60)
pH, Ven: 7.37 (ref 7.25–7.43)
pO2, Ven: 34 mmHg (ref 32–45)

## 2022-11-10 LAB — I-STAT ARTERIAL BLOOD GAS, ED
Acid-Base Excess: 3 mmol/L — ABNORMAL HIGH (ref 0.0–2.0)
Bicarbonate: 31.4 mmol/L — ABNORMAL HIGH (ref 20.0–28.0)
Calcium, Ion: 1.26 mmol/L (ref 1.15–1.40)
HCT: 40 % (ref 39.0–52.0)
Hemoglobin: 13.6 g/dL (ref 13.0–17.0)
O2 Saturation: 92 %
Patient temperature: 100
Potassium: 4.2 mmol/L (ref 3.5–5.1)
Sodium: 136 mmol/L (ref 135–145)
TCO2: 33 mmol/L — ABNORMAL HIGH (ref 22–32)
pCO2 arterial: 65.6 mmHg (ref 32–48)
pH, Arterial: 7.292 — ABNORMAL LOW (ref 7.35–7.45)
pO2, Arterial: 77 mmHg — ABNORMAL LOW (ref 83–108)

## 2022-11-10 LAB — URINALYSIS, W/ REFLEX TO CULTURE (INFECTION SUSPECTED)
Bacteria, UA: NONE SEEN
Bilirubin Urine: NEGATIVE
Glucose, UA: NEGATIVE mg/dL
Hgb urine dipstick: NEGATIVE
Ketones, ur: NEGATIVE mg/dL
Leukocytes,Ua: NEGATIVE
Nitrite: NEGATIVE
Protein, ur: NEGATIVE mg/dL
Specific Gravity, Urine: 1.033 — ABNORMAL HIGH (ref 1.005–1.030)
pH: 5 (ref 5.0–8.0)

## 2022-11-10 LAB — I-STAT VENOUS BLOOD GAS, ED
Acid-Base Excess: 3 mmol/L — ABNORMAL HIGH (ref 0.0–2.0)
Acid-Base Excess: 3 mmol/L — ABNORMAL HIGH (ref 0.0–2.0)
Bicarbonate: 28.8 mmol/L — ABNORMAL HIGH (ref 20.0–28.0)
Bicarbonate: 32 mmol/L — ABNORMAL HIGH (ref 20.0–28.0)
Calcium, Ion: 1.17 mmol/L (ref 1.15–1.40)
Calcium, Ion: 1.23 mmol/L (ref 1.15–1.40)
HCT: 41 % (ref 39.0–52.0)
HCT: 41 % (ref 39.0–52.0)
Hemoglobin: 13.9 g/dL (ref 13.0–17.0)
Hemoglobin: 13.9 g/dL (ref 13.0–17.0)
O2 Saturation: 85 %
O2 Saturation: 91 %
Potassium: 3.9 mmol/L (ref 3.5–5.1)
Potassium: 4.2 mmol/L (ref 3.5–5.1)
Sodium: 136 mmol/L (ref 135–145)
Sodium: 136 mmol/L (ref 135–145)
TCO2: 30 mmol/L (ref 22–32)
TCO2: 34 mmol/L — ABNORMAL HIGH (ref 22–32)
pCO2, Ven: 46.9 mmHg (ref 44–60)
pCO2, Ven: 70.5 mmHg (ref 44–60)
pH, Ven: 7.397 (ref 7.25–7.43)
pH, Ven: 7.265 (ref 7.25–7.43)
pO2, Ven: 51 mmHg — ABNORMAL HIGH (ref 32–45)
pO2, Ven: 71 mmHg — ABNORMAL HIGH (ref 32–45)

## 2022-11-10 LAB — I-STAT CHEM 8, ED
BUN: 11 mg/dL (ref 6–20)
Calcium, Ion: 1.18 mmol/L (ref 1.15–1.40)
Chloride: 98 mmol/L (ref 98–111)
Creatinine, Ser: 0.7 mg/dL (ref 0.61–1.24)
Glucose, Bld: 110 mg/dL — ABNORMAL HIGH (ref 70–99)
HCT: 41 % (ref 39.0–52.0)
Hemoglobin: 13.9 g/dL (ref 13.0–17.0)
Potassium: 3.9 mmol/L (ref 3.5–5.1)
Sodium: 136 mmol/L (ref 135–145)
TCO2: 28 mmol/L (ref 22–32)

## 2022-11-10 LAB — GLUCOSE, CAPILLARY
Glucose-Capillary: 102 mg/dL — ABNORMAL HIGH (ref 70–99)
Glucose-Capillary: 91 mg/dL (ref 70–99)
Glucose-Capillary: 114 mg/dL — ABNORMAL HIGH (ref 70–99)
Glucose-Capillary: 112 mg/dL — ABNORMAL HIGH (ref 70–99)
Glucose-Capillary: 101 mg/dL — ABNORMAL HIGH (ref 70–99)

## 2022-11-10 LAB — RAPID URINE DRUG SCREEN, HOSP PERFORMED
Amphetamines: NOT DETECTED
Barbiturates: NOT DETECTED
Benzodiazepines: POSITIVE — AB
Cocaine: NOT DETECTED
Opiates: NOT DETECTED
Tetrahydrocannabinol: POSITIVE — AB

## 2022-11-10 LAB — LACTIC ACID, PLASMA
Lactic Acid, Venous: 1.4 mmol/L (ref 0.5–1.9)
Lactic Acid, Venous: 0.6 mmol/L (ref 0.5–1.9)

## 2022-11-10 LAB — AEROBIC CULTURE W GRAM STAIN (SUPERFICIAL SPECIMEN)

## 2022-11-10 LAB — PROTIME-INR
INR: 1.1 (ref 0.8–1.2)
Prothrombin Time: 14.3 seconds (ref 11.4–15.2)

## 2022-11-10 LAB — RESP PANEL BY RT-PCR (RSV, FLU A&B, COVID)  RVPGX2
Influenza A by PCR: NEGATIVE
Influenza B by PCR: NEGATIVE
Resp Syncytial Virus by PCR: NEGATIVE
SARS Coronavirus 2 by RT PCR: NEGATIVE

## 2022-11-10 LAB — CREATININE, SERUM
Creatinine, Ser: 0.79 mg/dL (ref 0.61–1.24)
GFR, Estimated: >60 mL/min (ref >60)

## 2022-11-10 LAB — MAGNESIUM: Magnesium: 2.3 mg/dL (ref 1.7–2.4)

## 2022-11-10 LAB — MRSA NEXT GEN BY PCR, NASAL: MRSA by PCR Next Gen: DETECTED — AB

## 2022-11-10 LAB — STREP PNEUMONIAE URINARY ANTIGEN: Strep Pneumo Urinary Antigen: NEGATIVE

## 2022-11-10 LAB — BRAIN NATRIURETIC PEPTIDE
B Natriuretic Peptide: 11.3 pg/mL (ref 0.0–100.0)
B Natriuretic Peptide: 9.7 pg/mL (ref 0.0–100.0)

## 2022-11-10 LAB — APTT: aPTT: 33 seconds (ref 24–36)

## 2022-11-10 LAB — TROPONIN I (HIGH SENSITIVITY): Troponin I (High Sensitivity): 13 ng/L (ref <18)

## 2022-11-10 LAB — ETHANOL: Alcohol, Ethyl (B): <10 mg/dL (ref <10)

## 2022-11-10 LAB — PROCALCITONIN: Procalcitonin: <0.1 ng/mL

## 2022-11-10 MED ORDER — MIDAZOLAM HCL 2 MG/2ML IJ SOLN
INTRAMUSCULAR | Status: AC
  Filled 2022-11-10: qty 2

## 2022-11-10 MED ORDER — INSULIN ASPART 100 UNIT/ML IJ SOLN: 0.0000 [IU] | INTRAMUSCULAR | Status: DC

## 2022-11-10 MED ORDER — LACTATED RINGERS IV SOLN: INTRAVENOUS | Status: DC

## 2022-11-10 MED ORDER — PROPOFOL 1000 MG/100ML IV EMUL
0.0000 ug/kg/min | INTRAVENOUS | Status: DC
  Administered 2022-11-10: 5 ug/kg/min via INTRAVENOUS
  Administered 2022-11-10 – 2022-11-11 (×6): 50 ug/kg/min via INTRAVENOUS
  Administered 2022-11-11: 40 ug/kg/min via INTRAVENOUS
  Administered 2022-11-11: 45 ug/kg/min via INTRAVENOUS
  Administered 2022-11-11 – 2022-11-12 (×12): 50 ug/kg/min via INTRAVENOUS
  Filled 2022-11-10: qty 200
  Filled 2022-11-10 (×3): qty 100
  Filled 2022-11-10: qty 200
  Filled 2022-11-10: qty 100
  Filled 2022-11-10: qty 200
  Filled 2022-11-10 (×5): qty 100
  Filled 2022-11-10 (×2): qty 200
  Filled 2022-11-10: qty 100
  Filled 2022-11-10 (×2): qty 200

## 2022-11-10 MED ORDER — VANCOMYCIN HCL 2000 MG/400ML IV SOLN
2000.0000 mg | Freq: Two times a day (BID) | INTRAVENOUS | Status: DC
  Administered 2022-11-10 (×2): 2000 mg via INTRAVENOUS
  Filled 2022-11-10 (×3): qty 400

## 2022-11-10 MED ORDER — ORAL CARE MOUTH RINSE
15.0000 mL | OROMUCOSAL | Status: DC
  Administered 2022-11-10 – 2022-11-12 (×21): 15 mL via OROMUCOSAL

## 2022-11-10 MED ORDER — ROCURONIUM BROMIDE 10 MG/ML (PF) SYRINGE
PREFILLED_SYRINGE | INTRAVENOUS | Status: AC
  Filled 2022-11-10: qty 10

## 2022-11-10 MED ORDER — ONDANSETRON HCL 4 MG/2ML IJ SOLN
4.0000 mg | Freq: Once | INTRAMUSCULAR | Status: AC
  Administered 2022-11-10: 4 mg via INTRAVENOUS
  Filled 2022-11-10: qty 2

## 2022-11-10 MED ORDER — MIDAZOLAM HCL 2 MG/2ML IJ SOLN
INTRAMUSCULAR | Status: AC
  Administered 2022-11-10: 4 mg
  Filled 2022-11-10: qty 2

## 2022-11-10 MED ORDER — LACTATED RINGERS IV BOLUS (SEPSIS)
1000.0000 mL | Freq: Once | INTRAVENOUS | Status: AC
  Administered 2022-11-10: 1000 mL via INTRAVENOUS

## 2022-11-10 MED ORDER — PROPOFOL 10 MG/ML IV BOLUS
300.0000 mg | Freq: Once | INTRAVENOUS | Status: AC
  Administered 2022-11-10: 150 mg via INTRAVENOUS
  Filled 2022-11-10: qty 40

## 2022-11-10 MED ORDER — INSULIN ASPART 100 UNIT/ML IJ SOLN: 0.0000 [IU] | Freq: Three times a day (TID) | INTRAMUSCULAR | Status: DC

## 2022-11-10 MED ORDER — MUPIROCIN 2 % EX OINT
1.0000 | TOPICAL_OINTMENT | Freq: Two times a day (BID) | CUTANEOUS | Status: AC
  Administered 2022-11-10 – 2022-11-15 (×10): 1 via NASAL
  Filled 2022-11-10: qty 22

## 2022-11-10 MED ORDER — ONDANSETRON HCL 4 MG/2ML IJ SOLN
4.0000 mg | Freq: Four times a day (QID) | INTRAMUSCULAR | Status: DC | PRN
  Filled 2022-11-10: qty 2

## 2022-11-10 MED ORDER — FENTANYL CITRATE PF 50 MCG/ML IJ SOSY
50.0000 ug | PREFILLED_SYRINGE | INTRAMUSCULAR | Status: DC | PRN
  Administered 2022-11-10 – 2022-11-11 (×2): 100 ug via INTRAVENOUS
  Filled 2022-11-10 (×3): qty 2

## 2022-11-10 MED ORDER — LISINOPRIL 10 MG PO TABS
10.0000 mg | ORAL_TABLET | Freq: Every day | ORAL | Status: DC
  Administered 2022-11-10 – 2022-11-11 (×2): 10 mg via ORAL
  Filled 2022-11-10 (×2): qty 1

## 2022-11-10 MED ORDER — POLYETHYLENE GLYCOL 3350 17 G PO PACK
17.0000 g | PACK | Freq: Every day | ORAL | Status: DC | PRN
  Filled 2022-11-10: qty 1

## 2022-11-10 MED ORDER — FENTANYL CITRATE PF 50 MCG/ML IJ SOSY
50.0000 ug | PREFILLED_SYRINGE | Freq: Once | INTRAMUSCULAR | Status: AC
  Administered 2022-11-10: 50 ug via INTRAVENOUS
  Filled 2022-11-10: qty 1

## 2022-11-10 MED ORDER — LABETALOL HCL 5 MG/ML IV SOLN
20.0000 mg | INTRAVENOUS | Status: DC | PRN
  Administered 2022-11-10 – 2022-11-14 (×3): 20 mg via INTRAVENOUS
  Filled 2022-11-10 (×4): qty 4

## 2022-11-10 MED ORDER — FENTANYL CITRATE PF 50 MCG/ML IJ SOSY: 50.0000 ug | PREFILLED_SYRINGE | INTRAMUSCULAR | Status: DC | PRN

## 2022-11-10 MED ORDER — LACTATED RINGERS IV BOLUS (SEPSIS): 1000.0000 mL | Freq: Once | INTRAVENOUS | Status: DC

## 2022-11-10 MED ORDER — ORAL CARE MOUTH RINSE: 15.0000 mL | OROMUCOSAL | Status: DC | PRN

## 2022-11-10 MED ORDER — PHENYLEPHRINE 80 MCG/ML (10ML) SYRINGE FOR IV PUSH (FOR BLOOD PRESSURE SUPPORT)
PREFILLED_SYRINGE | INTRAVENOUS | Status: AC
  Filled 2022-11-10: qty 10

## 2022-11-10 MED ORDER — PIPERACILLIN-TAZOBACTAM 3.375 G IVPB
3.3750 g | Freq: Three times a day (TID) | INTRAVENOUS | Status: DC
  Administered 2022-11-10: 3.375 g via INTRAVENOUS
  Filled 2022-11-10: qty 50

## 2022-11-10 MED ORDER — ROCURONIUM BROMIDE 10 MG/ML (PF) SYRINGE
150.0000 mg | PREFILLED_SYRINGE | Freq: Once | INTRAVENOUS | Status: AC
  Administered 2022-11-10: 150 mg via INTRAVENOUS
  Filled 2022-11-10: qty 20

## 2022-11-10 MED ORDER — LACTATED RINGERS IV BOLUS (SEPSIS): 400.0000 mL | Freq: Once | INTRAVENOUS | Status: DC

## 2022-11-10 MED ORDER — KETAMINE HCL 50 MG/5ML IJ SOSY
PREFILLED_SYRINGE | INTRAMUSCULAR | Status: AC
  Filled 2022-11-10: qty 10

## 2022-11-10 MED ORDER — ETOMIDATE 2 MG/ML IV SOLN
INTRAVENOUS | Status: AC
  Filled 2022-11-10: qty 20

## 2022-11-10 MED ORDER — METRONIDAZOLE 500 MG/100ML IV SOLN
500.0000 mg | Freq: Two times a day (BID) | INTRAVENOUS | Status: DC
  Administered 2022-11-10 – 2022-11-11 (×3): 500 mg via INTRAVENOUS
  Filled 2022-11-10 (×3): qty 100

## 2022-11-10 MED ORDER — ACETAMINOPHEN 160 MG/5ML PO SOLN
500.0000 mg | Freq: Three times a day (TID) | ORAL | Status: DC | PRN
  Administered 2022-11-10: 500 mg via ORAL
  Filled 2022-11-10: qty 20.3

## 2022-11-10 MED ORDER — DEXTROSE-NACL 5-0.45 % IV SOLN: INTRAVENOUS | Status: DC

## 2022-11-10 MED ORDER — SODIUM CHLORIDE 0.9 % IV SOLN
2.0000 g | Freq: Three times a day (TID) | INTRAVENOUS | Status: DC
  Administered 2022-11-10 – 2022-11-11 (×3): 2 g via INTRAVENOUS
  Filled 2022-11-10 (×5): qty 2

## 2022-11-10 MED ORDER — ACETAMINOPHEN 650 MG RE SUPP
650.0000 mg | Freq: Once | RECTAL | Status: AC
  Administered 2022-11-10: 650 mg via RECTAL
  Filled 2022-11-10: qty 1

## 2022-11-10 MED ORDER — DOCUSATE SODIUM 50 MG/5ML PO LIQD
100.0000 mg | Freq: Two times a day (BID) | ORAL | Status: DC
  Administered 2022-11-10 – 2022-11-12 (×4): 100 mg
  Filled 2022-11-10 (×4): qty 10

## 2022-11-10 MED ORDER — IOHEXOL 350 MG/ML SOLN
100.0000 mL | Freq: Once | INTRAVENOUS | Status: AC | PRN
  Administered 2022-11-10: 100 mL via INTRAVENOUS

## 2022-11-10 MED ORDER — PANTOPRAZOLE SODIUM 40 MG IV SOLR
40.0000 mg | INTRAVENOUS | Status: DC
  Administered 2022-11-10 – 2022-11-13 (×4): 40 mg via INTRAVENOUS
  Filled 2022-11-10 (×4): qty 10

## 2022-11-10 MED ORDER — VANCOMYCIN HCL 2000 MG/400ML IV SOLN
2000.0000 mg | Freq: Once | INTRAVENOUS | Status: AC
  Administered 2022-11-10: 2000 mg via INTRAVENOUS
  Filled 2022-11-10 (×2): qty 400

## 2022-11-10 MED ORDER — POLYETHYLENE GLYCOL 3350 17 G PO PACK
17.0000 g | PACK | Freq: Every day | ORAL | Status: DC
  Administered 2022-11-10 – 2022-11-12 (×3): 17 g
  Filled 2022-11-10 (×3): qty 1

## 2022-11-10 MED ORDER — SUCCINYLCHOLINE CHLORIDE 200 MG/10ML IV SOSY
PREFILLED_SYRINGE | INTRAVENOUS | Status: AC
  Filled 2022-11-10: qty 10

## 2022-11-10 MED ORDER — CHLORHEXIDINE GLUCONATE CLOTH 2 % EX PADS
6.0000 | MEDICATED_PAD | Freq: Every day | CUTANEOUS | Status: DC
  Administered 2022-11-11 – 2022-11-15 (×5): 6 via TOPICAL

## 2022-11-10 MED ORDER — FENTANYL CITRATE PF 50 MCG/ML IJ SOSY
PREFILLED_SYRINGE | INTRAMUSCULAR | Status: AC
  Administered 2022-11-10: 100 ug
  Filled 2022-11-10: qty 2

## 2022-11-10 MED ORDER — DOCUSATE SODIUM 100 MG PO CAPS
100.0000 mg | ORAL_CAPSULE | Freq: Two times a day (BID) | ORAL | Status: DC | PRN
  Administered 2022-11-14: 100 mg via ORAL
  Filled 2022-11-10: qty 1

## 2022-11-10 MED ORDER — HEPARIN SODIUM (PORCINE) 5000 UNIT/ML IJ SOLN
5000.0000 [IU] | Freq: Three times a day (TID) | INTRAMUSCULAR | Status: DC
  Administered 2022-11-10 – 2022-11-16 (×19): 5000 [IU] via SUBCUTANEOUS
  Filled 2022-11-10 (×19): qty 1

## 2022-11-10 MED ORDER — FUROSEMIDE 10 MG/ML IJ SOLN
60.0000 mg | Freq: Once | INTRAMUSCULAR | Status: AC
  Administered 2022-11-10: 60 mg via INTRAVENOUS
  Filled 2022-11-10: qty 6
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
## 2022-11-10 NOTE — ED Notes (Signed)
Lab results was reported to Nurse. 
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
## 2022-11-10 NOTE — ED Notes (Signed)
One set of blood cultures obtained prior to starting antibiotics. RNs unable to obtain second line or second set of blood cultures. IV team consult placed.

## 2022-11-10 NOTE — Progress Notes (Signed)
Patient was placed back per MD order. Patient was on NIV for maybe 3 minutes then promptly rips off the mask due to vomiting. Patient is actively vomiting at this time. RN is aware and at bedside.
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
## 2022-11-10 NOTE — ED Notes (Addendum)
Pt placed placed on nonrebreather at 15L by RT. Now at 100% Osat.

## 2022-11-10 NOTE — ED Notes (Signed)
RT and IV team at bedside at this time.

## 2022-11-10 NOTE — ED Notes (Signed)
EDP at BS 

## 2022-11-10 NOTE — ED Notes (Signed)
EDP x2, RT at [facility]. PT alert, NAD, calm, interactive, resps e/u, speaking clearluy. HF [place] 8L. VSS.

## 2022-11-10 NOTE — Progress Notes (Signed)
Pharmacy Antibiotic Note  Benjamin Garcia is a 32 y.o. male admitted on 11/10/2022 with  cellulitis/wound infection .  Pharmacy has been consulted for Vancomycin/Zosyn dosing. Labs reviewed.   Plan: Vancomycin 2000 mg IV q12h >>>Estimated AUC: 470 Zosyn 3.375G IV q8h to be infused over 4 hours Trend WBC, temp, renal function  F/U infectious work-up Drug levels as indicated   Height: 6' (182.9 cm) Weight: 136 kg (299 lb 13.2 oz) IBW/kg (Calculated) : 77.6  Temp (24hrs), Avg:99.4 F (37.4 C), Min:98.5 F (36.9 C), Max:101 F (38.3 C)  Recent Labs  Lab 11/07/22 1209 11/07/22 1214 11/07/22 1515 11/07/22 1830 11/07/22 2121 11/08/22 0722 11/09/22 0224 11/10/22 0212  WBC  --  9.3  --   --   --  12.4* 10.4  --   CREATININE  --  0.85  --   --   --  0.77 0.75 0.70  LATICACIDVEN 1.4  --  1.8 2.3* 2.3* 1.7  --   --     Estimated Creatinine Clearance: 189.4 mL/min (by C-G formula based on SCr of 0.7 mg/dL).    No Known Allergies  Narda Bonds, PharmD, BCPS Clinical Pharmacist Phone: [phone]

## 2022-11-10 NOTE — ED Notes (Signed)
Pt dropping Osat into the 80s. MD and RT notified.

## 2022-11-10 NOTE — ED Notes (Signed)
Assumed care of pt

## 2022-11-10 NOTE — Progress Notes (Signed)
Pt being followed by [facility] for Sepsis protocol. 

## 2022-11-10 NOTE — ED Notes (Signed)
RN contacted RT for pt

## 2022-11-10 NOTE — ED Notes (Signed)
Pt was on bipap for approximately 5 minutes before pulling off the mask and projectile vomited onto the floor. MD made aware. Order for zofran obtained. Pt placed back on 3L [place].

## 2022-11-10 NOTE — ED Notes (Signed)
MD consulted with RN to get in touch with RT to wean pt off of non re breather. Pt is now on seltzer at 8L.

## 2022-11-10 NOTE — Procedures (Addendum)
Intubation Procedure Note  Benjamin Garcia  [id number]  1990-12-23  Date:11/10/22  Time:6:30 PM   Provider Performing:Benjamin Garcia    Procedure: Intubation (31500)  Indication(s) Respiratory Failure  Consent Risks of the procedure as well as the alternatives and risks of each were explained to the patient and/or caregiver.  Consent for the procedure was obtained and is signed in the bedside chart   Anesthesia Rocuronium and Propofol   Time Out Verified patient identification, verified procedure, site/side was marked, verified correct patient position, special equipment/implants available, medications/allergies/relevant history reviewed, required imaging and test results available.   Sterile Technique Usual hand hygeine, masks, and gloves were used   Procedure Description Patient positioned in bed in ramp position with tragus at level of sternal notch.  Sedation given as noted above - after 100 mg of propofol, he was adequately sedated for intubation and became hypoxic requiring bag mask ventilation with oral airway. 150 mg rocuronium given. Easy bag with oral airway in place. Patient was intubated with endotracheal tube using Glidescope.  View was Grade 2 only posterior commissure .  Number of attempts was 1.  Colorimetric CO2 detector was consistent with tracheal placement.    Complications/Tolerance None; patient tolerated the procedure well. Chest X-ray is ordered to verify placement.   EBL Minimal   Specimen(s) None
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
## 2022-11-10 NOTE — ED Notes (Signed)
Report given to [place], [place] on [facility]
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
## 2022-11-10 NOTE — ED Notes (Signed)
Attempt to call nursing report. RN unavailable at this time.

## 2022-11-11 ENCOUNTER — Inpatient Hospital Stay (HOSPITAL_COMMUNITY): Payer: Medicare Other

## 2022-11-11 DIAGNOSIS — R0602 Shortness of breath: Secondary | ICD-10-CM | POA: Diagnosis not present

## 2022-11-11 DIAGNOSIS — J9601 Acute respiratory failure with hypoxia: Secondary | ICD-10-CM | POA: Diagnosis not present

## 2022-11-11 LAB — PHOSPHORUS
Phosphorus: 4.8 mg/dL — ABNORMAL HIGH (ref 2.5–4.6)
Phosphorus: 4.6 mg/dL (ref 2.5–4.6)

## 2022-11-11 LAB — MAGNESIUM
Magnesium: 2 mg/dL (ref 1.7–2.4)
Magnesium: 2.2 mg/dL (ref 1.7–2.4)

## 2022-11-11 LAB — POCT I-STAT 7, (LYTES, BLD GAS, ICA,H+H)
Acid-Base Excess: 4 mmol/L — ABNORMAL HIGH (ref 0.0–2.0)
Bicarbonate: 28.4 mmol/L — ABNORMAL HIGH (ref 20.0–28.0)
Calcium, Ion: 1.14 mmol/L — ABNORMAL LOW (ref 1.15–1.40)
HCT: 37 % — ABNORMAL LOW (ref 39.0–52.0)
Hemoglobin: 12.6 g/dL — ABNORMAL LOW (ref 13.0–17.0)
O2 Saturation: 99 %
Patient temperature: 98.4
Potassium: 3.3 mmol/L — ABNORMAL LOW (ref 3.5–5.1)
Sodium: 134 mmol/L — ABNORMAL LOW (ref 135–145)
TCO2: 30 mmol/L (ref 22–32)
pCO2 arterial: 39.8 mmHg (ref 32–48)
pH, Arterial: 7.46 — ABNORMAL HIGH (ref 7.35–7.45)
pO2, Arterial: 147 mmHg — ABNORMAL HIGH (ref 83–108)

## 2022-11-11 LAB — ECHOCARDIOGRAM COMPLETE
Area-P 1/2: 3.53 cm2
Height: 72.008 in
S' Lateral: 2.9 cm
Weight: 5971.82 oz

## 2022-11-11 LAB — BASIC METABOLIC PANEL
Anion gap: 12 (ref 5–15)
BUN: 16 mg/dL (ref 6–20)
CO2: 26 mmol/L (ref 22–32)
Calcium: 8.6 mg/dL — ABNORMAL LOW (ref 8.9–10.3)
Chloride: 94 mmol/L — ABNORMAL LOW (ref 98–111)
Creatinine, Ser: 2 mg/dL — ABNORMAL HIGH (ref 0.61–1.24)
GFR, Estimated: 45 mL/min — ABNORMAL LOW (ref >60)
Glucose, Bld: 100 mg/dL — ABNORMAL HIGH (ref 70–99)
Potassium: 3.2 mmol/L — ABNORMAL LOW (ref 3.5–5.1)
Sodium: 132 mmol/L — ABNORMAL LOW (ref 135–145)

## 2022-11-11 LAB — GLUCOSE, CAPILLARY
Glucose-Capillary: 101 mg/dL — ABNORMAL HIGH (ref 70–99)
Glucose-Capillary: 104 mg/dL — ABNORMAL HIGH (ref 70–99)
Glucose-Capillary: 82 mg/dL (ref 70–99)
Glucose-Capillary: 86 mg/dL (ref 70–99)
Glucose-Capillary: 94 mg/dL (ref 70–99)
Glucose-Capillary: 99 mg/dL (ref 70–99)

## 2022-11-11 LAB — CBC
HCT: 35.5 % — ABNORMAL LOW (ref 39.0–52.0)
Hemoglobin: 11.2 g/dL — ABNORMAL LOW (ref 13.0–17.0)
MCH: 25.3 pg — ABNORMAL LOW (ref 26.0–34.0)
MCHC: 31.5 g/dL (ref 30.0–36.0)
MCV: 80.3 fL (ref 80.0–100.0)
Platelets: 196 10*3/uL (ref 150–400)
RBC: 4.42 MIL/uL (ref 4.22–5.81)
RDW: 15.6 % — ABNORMAL HIGH (ref 11.5–15.5)
WBC: 6.3 10*3/uL (ref 4.0–10.5)
nRBC: 0 % (ref 0.0–0.2)

## 2022-11-11 LAB — TRIGLYCERIDES: Triglycerides: 211 mg/dL — ABNORMAL HIGH (ref <150)

## 2022-11-11 LAB — LACTIC ACID, PLASMA: Lactic Acid, Venous: 1.2 mmol/L (ref 0.5–1.9)

## 2022-11-11 MED ORDER — FENTANYL 2500MCG IN NS 250ML (10MCG/ML) PREMIX INFUSION
50.0000 ug/h | INTRAVENOUS | Status: DC
  Administered 2022-11-11: 200 ug/h via INTRAVENOUS
  Administered 2022-11-11: 50 ug/h via INTRAVENOUS
  Administered 2022-11-12: 200 ug/h via INTRAVENOUS
  Filled 2022-11-11 (×3): qty 250

## 2022-11-11 MED ORDER — DOCUSATE SODIUM 50 MG/5ML PO LIQD: 100.0000 mg | Freq: Two times a day (BID) | ORAL | Status: DC

## 2022-11-11 MED ORDER — VITAL 1.5 CAL PO LIQD
1000.0000 mL | ORAL | Status: DC
  Administered 2022-11-11: 1000 mL

## 2022-11-11 MED ORDER — LINEZOLID 600 MG/300ML IV SOLN
600.0000 mg | Freq: Two times a day (BID) | INTRAVENOUS | Status: DC
  Administered 2022-11-11 – 2022-11-13 (×4): 600 mg via INTRAVENOUS
  Filled 2022-11-11 (×5): qty 300

## 2022-11-11 MED ORDER — SODIUM CHLORIDE 0.9 % IV SOLN
1.0000 g | Freq: Three times a day (TID) | INTRAVENOUS | Status: DC
  Administered 2022-11-11 – 2022-11-16 (×14): 1 g via INTRAVENOUS
  Filled 2022-11-11 (×14): qty 20

## 2022-11-11 MED ORDER — POTASSIUM CHLORIDE 10 MEQ/100ML IV SOLN
10.0000 meq | INTRAVENOUS | Status: AC
  Administered 2022-11-11 (×4): 10 meq via INTRAVENOUS
  Filled 2022-11-11 (×4): qty 100

## 2022-11-11 MED ORDER — SODIUM CHLORIDE 3 % IN NEBU
4.0000 mL | INHALATION_SOLUTION | Freq: Two times a day (BID) | RESPIRATORY_TRACT | Status: AC
  Administered 2022-11-11 – 2022-11-13 (×5): 4 mL via RESPIRATORY_TRACT
  Filled 2022-11-11 (×6): qty 4

## 2022-11-11 MED ORDER — IPRATROPIUM-ALBUTEROL 0.5-2.5 (3) MG/3ML IN SOLN: 3.0000 mL | Freq: Two times a day (BID) | RESPIRATORY_TRACT | Status: DC

## 2022-11-11 MED ORDER — VANCOMYCIN VARIABLE DOSE PER UNSTABLE RENAL FUNCTION (PHARMACIST DOSING): Status: DC

## 2022-11-11 MED ORDER — FENTANYL CITRATE PF 50 MCG/ML IJ SOSY
50.0000 ug | PREFILLED_SYRINGE | Freq: Once | INTRAMUSCULAR | Status: AC
  Administered 2022-11-11: 50 ug via INTRAVENOUS

## 2022-11-11 MED ORDER — FENTANYL BOLUS VIA INFUSION
50.0000 ug | INTRAVENOUS | Status: DC | PRN
  Administered 2022-11-11 – 2022-11-12 (×6): 50 ug via INTRAVENOUS
  Administered 2022-11-12: 100 ug via INTRAVENOUS
  Administered 2022-11-12: 50 ug via INTRAVENOUS

## 2022-11-11 MED ORDER — ALBUTEROL SULFATE (2.5 MG/3ML) 0.083% IN NEBU
INHALATION_SOLUTION | RESPIRATORY_TRACT | Status: AC
  Administered 2022-11-11: 10 mg
  Filled 2022-11-11: qty 12

## 2022-11-11 MED ORDER — MIDAZOLAM HCL 2 MG/2ML IJ SOLN
2.0000 mg | INTRAMUSCULAR | Status: DC | PRN
  Administered 2022-11-11 – 2022-11-12 (×3): 2 mg via INTRAVENOUS
  Filled 2022-11-11 (×3): qty 2

## 2022-11-11 MED ORDER — PROSOURCE TF20 ENFIT COMPATIBL EN LIQD
60.0000 mL | Freq: Two times a day (BID) | ENTERAL | Status: DC
  Administered 2022-11-11 – 2022-11-12 (×3): 60 mL
  Filled 2022-11-11 (×3): qty 60

## 2022-11-11 MED ORDER — MIDAZOLAM HCL 2 MG/2ML IJ SOLN
4.0000 mg | Freq: Once | INTRAMUSCULAR | Status: AC
  Administered 2022-11-11: 4 mg via INTRAVENOUS

## 2022-11-11 MED ORDER — GUAIFENESIN 100 MG/5ML PO LIQD
10.0000 mL | Freq: Four times a day (QID) | ORAL | Status: AC
  Administered 2022-11-11 – 2022-11-13 (×8): 10 mL via ORAL
  Filled 2022-11-11 (×8): qty 10

## 2022-11-11 MED ORDER — POLYETHYLENE GLYCOL 3350 17 G PO PACK: 17.0000 g | PACK | Freq: Every day | ORAL | Status: DC

## 2022-11-11 MED ORDER — MIDAZOLAM HCL 2 MG/2ML IJ SOLN
INTRAMUSCULAR | Status: AC
  Filled 2022-11-11: qty 4

## 2022-11-11 MED ORDER — IPRATROPIUM-ALBUTEROL 0.5-2.5 (3) MG/3ML IN SOLN
3.0000 mL | Freq: Two times a day (BID) | RESPIRATORY_TRACT | Status: DC
  Administered 2022-11-11 – 2022-11-16 (×10): 3 mL via RESPIRATORY_TRACT
  Filled 2022-11-11 (×10): qty 3
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
## 2022-11-11 NOTE — Progress Notes (Signed)
Respiratory culture collected by RT and sent to Lab. RT will monitor.
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
## 2022-11-11 NOTE — Progress Notes (Signed)
  Echocardiogram 2D Echocardiogram has been performed.  Benjamin Garcia 11/11/2022, 12:01 PM

## 2022-11-11 NOTE — Progress Notes (Signed)
Pharmacy Electrolyte Replacement  Recent Labs:  Recent Labs    11/11/22 0610  K 3.2*  MG 2.0  PHOS 4.8*  CREATININE 2.00*    Low Critical Values (K </= 2.5, Phos </= 1, Mg </= 1) Present: None   Plan:  KCL 42meq IV Q1h X 4 doses

## 2022-11-12 DIAGNOSIS — J9602 Acute respiratory failure with hypercapnia: Secondary | ICD-10-CM | POA: Diagnosis not present

## 2022-11-12 DIAGNOSIS — J9601 Acute respiratory failure with hypoxia: Secondary | ICD-10-CM | POA: Diagnosis not present

## 2022-11-12 LAB — CULTURE, BLOOD (ROUTINE X 2)
Culture: NO GROWTH
Culture: NO GROWTH

## 2022-11-12 LAB — CBC
HCT: 36.7 % — ABNORMAL LOW (ref 39.0–52.0)
Hemoglobin: 12 g/dL — ABNORMAL LOW (ref 13.0–17.0)
MCH: 25.6 pg — ABNORMAL LOW (ref 26.0–34.0)
MCHC: 32.7 g/dL (ref 30.0–36.0)
MCV: 78.4 fL — ABNORMAL LOW (ref 80.0–100.0)
Platelets: 219 K/uL (ref 150–400)
RBC: 4.68 MIL/uL (ref 4.22–5.81)
RDW: 15.5 % (ref 11.5–15.5)
WBC: 7.2 K/uL (ref 4.0–10.5)
nRBC: 0 % (ref 0.0–0.2)

## 2022-11-12 LAB — BASIC METABOLIC PANEL
Anion gap: 12 (ref 5–15)
BUN: 19 mg/dL (ref 6–20)
CO2: 26 mmol/L (ref 22–32)
Calcium: 8.6 mg/dL — ABNORMAL LOW (ref 8.9–10.3)
Chloride: 95 mmol/L — ABNORMAL LOW (ref 98–111)
Creatinine, Ser: 1.03 mg/dL (ref 0.61–1.24)
GFR, Estimated: >60 mL/min (ref >60)
Glucose, Bld: 90 mg/dL (ref 70–99)
Potassium: 3.3 mmol/L — ABNORMAL LOW (ref 3.5–5.1)
Sodium: 133 mmol/L — ABNORMAL LOW (ref 135–145)

## 2022-11-12 LAB — GLUCOSE, CAPILLARY
Glucose-Capillary: 97 mg/dL (ref 70–99)
Glucose-Capillary: 95 mg/dL (ref 70–99)
Glucose-Capillary: 112 mg/dL — ABNORMAL HIGH (ref 70–99)
Glucose-Capillary: 102 mg/dL — ABNORMAL HIGH (ref 70–99)
Glucose-Capillary: 92 mg/dL (ref 70–99)
Glucose-Capillary: 115 mg/dL — ABNORMAL HIGH (ref 70–99)

## 2022-11-12 LAB — LEGIONELLA PNEUMOPHILA SEROGP 1 UR AG: L. pneumophila Serogp 1 Ur Ag: NEGATIVE

## 2022-11-12 LAB — MAGNESIUM
Magnesium: 2.3 mg/dL (ref 1.7–2.4)
Magnesium: 2.3 mg/dL (ref 1.7–2.4)

## 2022-11-12 LAB — PHOSPHORUS
Phosphorus: 4.5 mg/dL (ref 2.5–4.6)
Phosphorus: 4 mg/dL (ref 2.5–4.6)

## 2022-11-12 LAB — TRIGLYCERIDES: Triglycerides: 281 mg/dL — ABNORMAL HIGH (ref <150)

## 2022-11-12 MED ORDER — PROPOFOL 1000 MG/100ML IV EMUL
INTRAVENOUS | Status: AC
  Filled 2022-11-12: qty 100

## 2022-11-12 MED ORDER — ORAL CARE MOUTH RINSE: 15.0000 mL | OROMUCOSAL | Status: DC | PRN

## 2022-11-12 MED ORDER — AMLODIPINE BESYLATE 5 MG PO TABS
5.0000 mg | ORAL_TABLET | Freq: Every day | ORAL | Status: DC
  Administered 2022-11-12: 5 mg
  Filled 2022-11-12: qty 1

## 2022-11-12 MED ORDER — KETAMINE HCL 50 MG/5ML IJ SOSY
PREFILLED_SYRINGE | INTRAMUSCULAR | Status: AC
  Filled 2022-11-12: qty 10

## 2022-11-12 MED ORDER — ACETAMINOPHEN 325 MG PO TABS
650.0000 mg | ORAL_TABLET | ORAL | Status: AC | PRN
  Administered 2022-11-12 – 2022-11-13 (×2): 650 mg via ORAL
  Filled 2022-11-12 (×2): qty 2

## 2022-11-12 MED ORDER — ETOMIDATE 2 MG/ML IV SOLN
INTRAVENOUS | Status: AC
  Filled 2022-11-12: qty 20

## 2022-11-12 MED ORDER — FUROSEMIDE 10 MG/ML IJ SOLN
60.0000 mg | Freq: Once | INTRAMUSCULAR | Status: AC
  Administered 2022-11-12: 60 mg via INTRAVENOUS
  Filled 2022-11-12: qty 6

## 2022-11-12 MED ORDER — ROCURONIUM BROMIDE 10 MG/ML (PF) SYRINGE
PREFILLED_SYRINGE | INTRAVENOUS | Status: AC
  Filled 2022-11-12: qty 10

## 2022-11-12 MED ORDER — MIDAZOLAM HCL 2 MG/2ML IJ SOLN
INTRAMUSCULAR | Status: AC
  Administered 2022-11-12: 2 mg
  Filled 2022-11-12: qty 10

## 2022-11-12 MED ORDER — LORAZEPAM 0.5 MG PO TABS
0.5000 mg | ORAL_TABLET | ORAL | Status: DC | PRN
  Administered 2022-11-12 – 2022-11-14 (×5): 0.5 mg via ORAL
  Filled 2022-11-12 (×5): qty 1

## 2022-11-12 MED ORDER — FENTANYL CITRATE PF 50 MCG/ML IJ SOSY
PREFILLED_SYRINGE | INTRAMUSCULAR | Status: AC
  Filled 2022-11-12: qty 2

## 2022-11-12 MED ORDER — SUCCINYLCHOLINE CHLORIDE 200 MG/10ML IV SOSY
PREFILLED_SYRINGE | INTRAVENOUS | Status: AC
  Filled 2022-11-12: qty 10

## 2022-11-12 MED ORDER — ALBUTEROL SULFATE (2.5 MG/3ML) 0.083% IN NEBU
INHALATION_SOLUTION | RESPIRATORY_TRACT | Status: AC
  Administered 2022-11-12: 10 mg
  Filled 2022-11-12: qty 12

## 2022-11-12 MED ORDER — MIDAZOLAM HCL 2 MG/2ML IJ SOLN
INTRAMUSCULAR | Status: AC
  Filled 2022-11-12: qty 2

## 2022-11-12 MED ORDER — POTASSIUM CHLORIDE 20 MEQ PO PACK
60.0000 meq | PACK | Freq: Once | ORAL | Status: AC
  Administered 2022-11-12: 60 meq
  Filled 2022-11-12: qty 3

## 2022-11-12 MED ORDER — LORAZEPAM 0.5 MG PO TABS: 0.5000 mg | ORAL_TABLET | ORAL | Status: DC | PRN

## 2022-11-12 MED ORDER — THIAMINE HCL 100 MG/ML IJ SOLN
100.0000 mg | Freq: Every day | INTRAMUSCULAR | Status: DC
  Administered 2022-11-12 – 2022-11-13 (×2): 100 mg via INTRAVENOUS
  Filled 2022-11-12 (×2): qty 2
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
## 2022-11-12 NOTE — Procedures (Addendum)
Extubation Procedure Note  Patient Details:   Name: Benjamin Garcia DOB: 1990/12/04 MRN: [id number]   Airway Documentation:    Vent end date: 11/12/22 Vent end time: 1545   Evaluation  O2 sats: stable throughout Complications: No apparent complications Patient did tolerate procedure well. Bilateral Breath Sounds: Diminished, Rhonchi   Yes  Pt extubated to Heated HFNC 40L 100% per CCM MD at bedside.  Pt tolerating well, no stridor noted,MD at bedside, RN at bedside, RT will monitor.   Nani Ravens 11/12/2022, 4:28 PM

## 2022-11-12 NOTE — Progress Notes (Signed)
RT called to bedside due to pt aggravation, vent asynchrony and vent alarming. When RT to bedside pt was very asynchronous, peak pressures of 46, min ventilation of 22 and pt was trying to pull out ET tube. RT suctioned pt and got out copious amounts of tan secretions from ET tube and orally. MD at beside decided to extubate pt. Pt was then extubated to HFNC and NRB SpO2 in low 92-93%. pt placed on Heated HFNC  40L 100% per MD at bedside. Pt was also given 10mg  albuterol neb. pt tolerating well, vitals stable SpO2 96%.      11/12/22 1600  Therapy Vitals  Pulse Rate (!) 108  Resp 20  BP (!) 190/134  MEWS Score/Color  MEWS Score 2  MEWS Score Color Yellow  Respiratory Assessment  Assessment Type Assess only  Respiratory Pattern Regular;Unlabored  Chest Assessment Chest expansion symmetrical  Cough Productive  Sputum Amount Copious  Sputum Color Tan  Sputum Consistency Thick  Sputum Specimen Source Oral  Bilateral Breath Sounds Diminished;Rhonchi  Oxygen Therapy/Pulse Ox  O2 Device HFNC  O2 Therapy Oxygen humidified  Heater temperature 93.2 F (34 C)  O2 Flow Rate (L/min) 40 L/min  FiO2 (%) 100 %  SpO2 95 %

## 2022-11-12 NOTE — Progress Notes (Signed)
Extubation
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
## 2022-11-12 NOTE — Progress Notes (Signed)
eLink Physician-Brief Progress Note Patient Name: Benjamin Garcia DOB: Oct 02, 1990 MRN: [id number]   Date of Service  11/12/2022  HPI/Events of Note  Patient extubated today, complaining of pain at Foley insertion site, also a bit anxious per bedside RN, has a history of ETOH use,but also a history of OSA.  eICU Interventions  Foley discontinued, CIWA monitoring by bedside RN to get some objective assessment data, Ativan 0.5 mg po Q 4 hours PRN anxiety / agitation, delirium. PRN Tylenol for aches and pains.        Frederik Pear 11/12/2022, 7:57 PM

## 2022-11-13 DIAGNOSIS — J9601 Acute respiratory failure with hypoxia: Secondary | ICD-10-CM | POA: Diagnosis not present

## 2022-11-13 DIAGNOSIS — J9602 Acute respiratory failure with hypercapnia: Secondary | ICD-10-CM | POA: Diagnosis not present

## 2022-11-13 LAB — CBC
HCT: 38.3 % — ABNORMAL LOW (ref 39.0–52.0)
Hemoglobin: 12.7 g/dL — ABNORMAL LOW (ref 13.0–17.0)
MCH: 26 pg (ref 26.0–34.0)
MCHC: 33.2 g/dL (ref 30.0–36.0)
MCV: 78.3 fL — ABNORMAL LOW (ref 80.0–100.0)
Platelets: 233 10*3/uL (ref 150–400)
RBC: 4.89 MIL/uL (ref 4.22–5.81)
RDW: 15.3 % (ref 11.5–15.5)
WBC: 10.2 10*3/uL (ref 4.0–10.5)
nRBC: 0 % (ref 0.0–0.2)

## 2022-11-13 LAB — GLUCOSE, CAPILLARY
Glucose-Capillary: 106 mg/dL — ABNORMAL HIGH (ref 70–99)
Glucose-Capillary: 96 mg/dL (ref 70–99)
Glucose-Capillary: 106 mg/dL — ABNORMAL HIGH (ref 70–99)
Glucose-Capillary: 149 mg/dL — ABNORMAL HIGH (ref 70–99)
Glucose-Capillary: 118 mg/dL — ABNORMAL HIGH (ref 70–99)
Glucose-Capillary: 88 mg/dL (ref 70–99)

## 2022-11-13 LAB — BASIC METABOLIC PANEL
Anion gap: 9 (ref 5–15)
BUN: 14 mg/dL (ref 6–20)
CO2: 29 mmol/L (ref 22–32)
Calcium: 8.9 mg/dL (ref 8.9–10.3)
Chloride: 95 mmol/L — ABNORMAL LOW (ref 98–111)
Creatinine, Ser: 0.69 mg/dL (ref 0.61–1.24)
GFR, Estimated: >60 mL/min (ref >60)
Glucose, Bld: 103 mg/dL — ABNORMAL HIGH (ref 70–99)
Potassium: 3.4 mmol/L — ABNORMAL LOW (ref 3.5–5.1)
Sodium: 133 mmol/L — ABNORMAL LOW (ref 135–145)

## 2022-11-13 LAB — MAGNESIUM: Magnesium: 2.2 mg/dL (ref 1.7–2.4)

## 2022-11-13 LAB — PHOSPHORUS: Phosphorus: 3.2 mg/dL (ref 2.5–4.6)

## 2022-11-13 LAB — TRIGLYCERIDES: Triglycerides: 191 mg/dL — ABNORMAL HIGH (ref <150)

## 2022-11-13 MED ORDER — ADULT MULTIVITAMIN W/MINERALS CH
1.0000 | ORAL_TABLET | Freq: Every day | ORAL | Status: DC
  Administered 2022-11-13 – 2022-11-15 (×3): 1 via ORAL
  Filled 2022-11-13 (×4): qty 1

## 2022-11-13 MED ORDER — KETOROLAC TROMETHAMINE 15 MG/ML IJ SOLN
15.0000 mg | Freq: Once | INTRAMUSCULAR | Status: AC
  Administered 2022-11-13: 15 mg via INTRAVENOUS
  Filled 2022-11-13: qty 1

## 2022-11-13 MED ORDER — FUROSEMIDE 10 MG/ML IJ SOLN
40.0000 mg | Freq: Once | INTRAMUSCULAR | Status: AC
  Administered 2022-11-13: 40 mg via INTRAVENOUS
  Filled 2022-11-13: qty 4

## 2022-11-13 MED ORDER — ONDANSETRON HCL 4 MG/2ML IJ SOLN
4.0000 mg | Freq: Once | INTRAMUSCULAR | Status: AC
  Administered 2022-11-13: 4 mg via INTRAVENOUS

## 2022-11-13 MED ORDER — ENSURE ENLIVE PO LIQD
237.0000 mL | Freq: Two times a day (BID) | ORAL | Status: DC
  Administered 2022-11-13 – 2022-11-15 (×5): 237 mL via ORAL

## 2022-11-13 MED ORDER — AMLODIPINE BESYLATE 10 MG PO TABS
10.0000 mg | ORAL_TABLET | Freq: Every day | ORAL | Status: DC
  Administered 2022-11-13 – 2022-11-15 (×3): 10 mg via ORAL
  Filled 2022-11-13 (×4): qty 1

## 2022-11-13 MED ORDER — AMLODIPINE BESYLATE 10 MG PO TABS: 10.0000 mg | ORAL_TABLET | Freq: Every day | ORAL | Status: DC

## 2022-11-13 MED ORDER — POLYETHYLENE GLYCOL 3350 17 G PO PACK
17.0000 g | PACK | Freq: Every day | ORAL | Status: DC
  Administered 2022-11-13 – 2022-11-14 (×2): 17 g via ORAL
  Filled 2022-11-13 (×2): qty 1

## 2022-11-13 MED ORDER — THIAMINE MONONITRATE 100 MG PO TABS
100.0000 mg | ORAL_TABLET | Freq: Every day | ORAL | Status: DC
  Administered 2022-11-14 – 2022-11-15 (×2): 100 mg via ORAL
  Filled 2022-11-13 (×3): qty 1

## 2022-11-13 MED ORDER — POTASSIUM CHLORIDE CRYS ER 20 MEQ PO TBCR
40.0000 meq | EXTENDED_RELEASE_TABLET | Freq: Once | ORAL | Status: AC
  Administered 2022-11-13: 40 meq via ORAL
  Filled 2022-11-13: qty 2

## 2022-11-13 MED ORDER — LINEZOLID 600 MG PO TABS
600.0000 mg | ORAL_TABLET | Freq: Two times a day (BID) | ORAL | Status: DC
  Administered 2022-11-13 – 2022-11-15 (×5): 600 mg via ORAL
  Filled 2022-11-13 (×6): qty 1

## 2022-11-13 MED ORDER — OXYCODONE HCL 5 MG PO TABS
5.0000 mg | ORAL_TABLET | Freq: Four times a day (QID) | ORAL | Status: DC | PRN
  Administered 2022-11-14 – 2022-11-15 (×6): 5 mg via ORAL
  Filled 2022-11-13 (×7): qty 1

## 2022-11-13 MED ORDER — INSULIN ASPART 100 UNIT/ML IJ SOLN
0.0000 [IU] | Freq: Three times a day (TID) | INTRAMUSCULAR | Status: DC
  Administered 2022-11-13 – 2022-11-15 (×4): 1 [IU] via SUBCUTANEOUS

## 2022-11-13 MED ORDER — AMLODIPINE BESYLATE 5 MG PO TABS
5.0000 mg | ORAL_TABLET | Freq: Every day | ORAL | Status: DC
  Filled 2022-11-13: qty 1

## 2022-11-13 MED ORDER — PREDNISONE 20 MG PO TABS
40.0000 mg | ORAL_TABLET | Freq: Every day | ORAL | Status: DC
  Administered 2022-11-13 – 2022-11-15 (×3): 40 mg via ORAL
  Filled 2022-11-13 (×3): qty 2
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
## 2022-11-13 NOTE — Progress Notes (Signed)
[place] Progress Note Patient Name: Benjamin Garcia DOB: 1990/09/02 MRN: [id number]   Date of Service  11/13/2022  HPI/Events of Note  Patient asking for additional pain medications, creatinine 1.03.  eICU Interventions  Toradol 15 mg iv x 1 ordered.        Benjamin Garcia 11/13/2022, 1:02 AM

## 2022-11-14 ENCOUNTER — Inpatient Hospital Stay (HOSPITAL_COMMUNITY): Payer: Medicare Other

## 2022-11-14 DIAGNOSIS — J9601 Acute respiratory failure with hypoxia: Secondary | ICD-10-CM | POA: Diagnosis not present

## 2022-11-14 LAB — BASIC METABOLIC PANEL
Anion gap: 13 (ref 5–15)
BUN: 10 mg/dL (ref 6–20)
CO2: 28 mmol/L (ref 22–32)
Calcium: 9.3 mg/dL (ref 8.9–10.3)
Chloride: 96 mmol/L — ABNORMAL LOW (ref 98–111)
Creatinine, Ser: 0.74 mg/dL (ref 0.61–1.24)
GFR, Estimated: >60 mL/min (ref >60)
Glucose, Bld: 121 mg/dL — ABNORMAL HIGH (ref 70–99)
Potassium: 3.9 mmol/L (ref 3.5–5.1)
Sodium: 137 mmol/L (ref 135–145)

## 2022-11-14 LAB — CULTURE, RESPIRATORY W GRAM STAIN

## 2022-11-14 LAB — CBC
HCT: 39.7 % (ref 39.0–52.0)
Hemoglobin: 13 g/dL (ref 13.0–17.0)
MCH: 25.9 pg — ABNORMAL LOW (ref 26.0–34.0)
MCHC: 32.7 g/dL (ref 30.0–36.0)
MCV: 79.2 fL — ABNORMAL LOW (ref 80.0–100.0)
Platelets: 272 10*3/uL (ref 150–400)
RBC: 5.01 MIL/uL (ref 4.22–5.81)
RDW: 15.5 % (ref 11.5–15.5)
WBC: 10.7 10*3/uL — ABNORMAL HIGH (ref 4.0–10.5)
nRBC: 0 % (ref 0.0–0.2)

## 2022-11-14 LAB — TRIGLYCERIDES: Triglycerides: 180 mg/dL — ABNORMAL HIGH (ref <150)

## 2022-11-14 LAB — GLUCOSE, CAPILLARY
Glucose-Capillary: 93 mg/dL (ref 70–99)
Glucose-Capillary: 88 mg/dL (ref 70–99)
Glucose-Capillary: 133 mg/dL — ABNORMAL HIGH (ref 70–99)
Glucose-Capillary: 140 mg/dL — ABNORMAL HIGH (ref 70–99)
Glucose-Capillary: 132 mg/dL — ABNORMAL HIGH (ref 70–99)

## 2022-11-14 MED ORDER — DOCUSATE SODIUM 100 MG PO CAPS
100.0000 mg | ORAL_CAPSULE | Freq: Two times a day (BID) | ORAL | Status: DC
  Administered 2022-11-15 (×2): 100 mg via ORAL
  Filled 2022-11-14 (×2): qty 1

## 2022-11-14 MED ORDER — POLYETHYLENE GLYCOL 3350 17 G PO PACK
17.0000 g | PACK | Freq: Two times a day (BID) | ORAL | Status: DC
  Filled 2022-11-14: qty 1

## 2022-11-14 MED ORDER — LORAZEPAM 0.5 MG PO TABS
0.5000 mg | ORAL_TABLET | Freq: Two times a day (BID) | ORAL | Status: DC | PRN
  Administered 2022-11-16: 0.5 mg via ORAL
  Filled 2022-11-14: qty 1

## 2022-11-14 MED ORDER — FUROSEMIDE 10 MG/ML IJ SOLN
40.0000 mg | Freq: Once | INTRAMUSCULAR | Status: AC
  Administered 2022-11-14: 40 mg via INTRAVENOUS
  Filled 2022-11-14: qty 4

## 2022-11-14 MED ORDER — POTASSIUM CHLORIDE CRYS ER 20 MEQ PO TBCR
40.0000 meq | EXTENDED_RELEASE_TABLET | Freq: Once | ORAL | Status: AC
  Administered 2022-11-14: 40 meq via ORAL
  Filled 2022-11-14: qty 2

## 2022-11-14 NOTE — Plan of Care (Signed)
  Problem: Education: Goal: Knowledge of General Education information will improve Description: Including pain rating scale, medication(s)/side effects and non-pharmacologic comfort measures Outcome: Progressing   Problem: Health Behavior/Discharge Planning: Goal: Ability to manage health-related needs will improve Outcome: Progressing   Problem: Clinical Measurements: Goal: Ability to maintain clinical measurements within normal limits will improve Outcome: Progressing Goal: Will remain free from infection Outcome: Progressing Goal: Diagnostic test results will improve Outcome: Progressing Goal: Respiratory complications will improve Outcome: Progressing Goal: Cardiovascular complication will be avoided Outcome: Progressing   Problem: Activity: Goal: Risk for activity intolerance will decrease Outcome: Progressing   Problem: Nutrition: Goal: Adequate nutrition will be maintained Outcome: Progressing   Problem: Coping: Goal: Level of anxiety will decrease Outcome: Progressing   Problem: Elimination: Goal: Will not experience complications related to bowel motility Outcome: Progressing Goal: Will not experience complications related to urinary retention Outcome: Progressing   Problem: Pain Managment: Goal: General experience of comfort will improve Outcome: Progressing   Problem: Safety: Goal: Ability to remain free from injury will improve Outcome: Progressing   Problem: Skin Integrity: Goal: Risk for impaired skin integrity will decrease Outcome: Progressing   Problem: Education: Goal: Ability to describe self-care measures that may prevent or decrease complications (Diabetes Survival Skills Education) will improve Outcome: Progressing Goal: Individualized Educational Video(s) Outcome: Progressing   Problem: Coping: Goal: Ability to adjust to condition or change in health will improve Outcome: Progressing   Problem: Fluid Volume: Goal: Ability to  maintain a balanced intake and output will improve Outcome: Progressing   Problem: Metabolic: Goal: Ability to maintain appropriate glucose levels will improve Outcome: Progressing   Problem: Nutritional: Goal: Maintenance of adequate nutrition will improve Outcome: Progressing Goal: Progress toward achieving an optimal weight will improve Outcome: Progressing   Problem: Skin Integrity: Goal: Risk for impaired skin integrity will decrease Outcome: Progressing   Problem: Tissue Perfusion: Goal: Adequacy of tissue perfusion will improve Outcome: Progressing  Patient did get out of bed today with lots of assistance patient found it difficult bed exercises reviewed with patient also with patient excessive drinking to help with respiratory status

## 2022-11-14 NOTE — Plan of Care (Signed)
  Problem: Education: Goal: Knowledge of General Education information will improve Description: Including pain rating scale, medication(s)/side effects and non-pharmacologic comfort measures Outcome: Progressing   Problem: Health Behavior/Discharge Planning: Goal: Ability to manage health-related needs will improve Outcome: Progressing   Problem: Clinical Measurements: Goal: Ability to maintain clinical measurements within normal limits will improve Outcome: Progressing Goal: Will remain free from infection Outcome: Progressing Goal: Diagnostic test results will improve Outcome: Progressing Goal: Respiratory complications will improve Outcome: Not Progressing Goal: Cardiovascular complication will be avoided Outcome: Progressing   Problem: Activity: Goal: Risk for activity intolerance will decrease Outcome: Not Progressing   Problem: Nutrition: Goal: Adequate nutrition will be maintained Outcome: Progressing   Problem: Coping: Goal: Level of anxiety will decrease Outcome: Progressing   Problem: Elimination: Goal: Will not experience complications related to bowel motility Outcome: Progressing Goal: Will not experience complications related to urinary retention Outcome: Progressing   Problem: Pain Managment: Goal: General experience of comfort will improve Outcome: Not Progressing   Problem: Safety: Goal: Ability to remain free from injury will improve Outcome: Progressing   Problem: Education: Goal: Ability to describe self-care measures that may prevent or decrease complications (Diabetes Survival Skills Education) will improve Outcome: Not Progressing Goal: Individualized Educational Video(s) Outcome: Not Progressing   Problem: Coping: Goal: Ability to adjust to condition or change in health will improve Outcome: Progressing   Problem: Fluid Volume: Goal: Ability to maintain a balanced intake and output will improve Outcome: Progressing   Problem:  Health Behavior/Discharge Planning: Goal: Ability to identify and utilize available resources and services will improve Outcome: Progressing Goal: Ability to manage health-related needs will improve Outcome: Progressing   Problem: Metabolic: Goal: Ability to maintain appropriate glucose levels will improve Outcome: Progressing   Problem: Nutritional: Goal: Maintenance of adequate nutrition will improve Outcome: Progressing Goal: Progress toward achieving an optimal weight will improve Outcome: Progressing   Problem: Skin Integrity: Goal: Risk for impaired skin integrity will decrease Outcome: Progressing   Problem: Tissue Perfusion: Goal: Adequacy of tissue perfusion will improve Outcome: Progressing   Problem: Safety: Goal: Non-violent Restraint(s) Outcome: Completed/Met

## 2022-11-14 NOTE — Progress Notes (Signed)
0055 patient alert able to make all needs known on HF Accident patient asking for pain medications for pain in left leg PRN medications given patient breathing exercises reviewed with patient to help with anxiety. Patient used urinal throughout the night frequent calls fort anxiety and snacks.  0430 Patient took off oxygen while coughing sp02 dropped into 70s recovered quickly with breathing exercises and putting 02 back on. PRN medication for anxiety given.

## 2022-11-14 NOTE — Progress Notes (Signed)
2000 patient alert able to make all needs known on heated high flow oxygen, patient concerned with being discharged on Monday to pay rent Rn encouraged patient to reach out to family to assist with helping drop off rent payment  0330 patient 02 drops to 70s while sleeping having to wake up patient to increase SP02 severe sleep apnea
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
## 2022-11-14 NOTE — Progress Notes (Signed)
Physical Therapy Wound Treatment Patient Details  Name: Benjamin Garcia MRN: [id number] Date of Birth: 1991/08/10  Today's Date: 11/14/2022 Time: [id number] Time Calculation (min): 35 min  Subjective  Subjective Assessment Subjective: Pt reporting a lot of pain despite being pre-medicated. Patient and Family Stated Goals: to heal wound Date of Onset: 10/14/22 Prior Treatments: dressing changes  Pain Score:  Pt reporting a lot of pain even with being pre-medicated  Wound Assessment  Wound / Incision (Open or Dehisced) 11/07/22 Other (Comment);Laceration Pretibial Left (Active)  Wound Image   11/11/22 1252  Dressing Type Gauze (Comment);Moist to dry;Other (Comment);Normal saline moist dressing 11/14/22 1623  Dressing Changed Changed 11/14/22 1623  Dressing Status Clean, Dry, Intact 11/14/22 1623  Dressing Change Frequency Twice a day 11/14/22 1623  Site / Wound Assessment Black;Brown;Granulation tissue;Pale;Pink;Yellow 11/14/22 1623  % Wound base Red or Granulating 45% 11/14/22 1623  % Wound base Yellow/Fibrinous Exudate 40% 11/14/22 1623  % Wound base Black/Eschar 15% 11/14/22 1623  % Wound base Other/Granulation Tissue (Comment) 0% 11/14/22 1623  Peri-wound Assessment Intact 11/14/22 1623  Wound Length (cm) 15 cm 11/12/22 1000  Wound Width (cm) 8 cm 11/12/22 1000  Wound Depth (cm) 0.8 cm 11/12/22 1000  Wound Volume (cm^3) 96 cm^3 11/12/22 1000  Wound Surface Area (cm^2) 120 cm^2 11/12/22 1000  Tunneling (cm) 0 11/12/22 1000  Undermining (cm) 1:00-5:00 1.0 cm 11/11/22 1252  Margins Unattached edges (unapproximated) 11/14/22 1623  Drainage Amount Moderate 11/14/22 1623  Drainage Description Serosanguineous 11/14/22 1623  Non-staged Wound Description Full thickness 11/14/22 1623  Treatment Debridement (Selective);Irrigation;Packing (Saline gauze) 11/14/22 1623   Selective Debridement (non-excisional) Selective Debridement (non-excisional) - Location: left lower leg Selective  Debridement (non-excisional) - Tools Used: Forceps, Scalpel Selective Debridement (non-excisional) - Tissue Removed: black eschar, yellow non-viable tissue, and necrotic adipose tissue    Wound Assessment and Plan  Wound Therapy - Assess/Plan/Recommendations Wound Therapy - Clinical Statement: Pt is no longer on the vent or sedated. Pt reporting lots of pain despite being pre-medicated this date. His wound bed appears to be improving with less black eschar tissue, but note more yellow non-viable and necrotic adipose tissue today. Patient can benefit from further hydrotherapy to remove necrotic tissue, decrease bioburden, and promote healing. Wound Therapy - Functional Problem List: pain limiting functional mobility Factors Delaying/Impairing Wound Healing: Infection - systemic/local, Immobility Hydrotherapy Plan: Debridement, Patient/family education, Dressing change, Other (comment) (irrigation) Wound Therapy - Frequency: 2X / week Wound Therapy - Current Recommendations: PT, OT Wound Therapy - Follow Up Recommendations: dressing changes by family/patient  Wound Therapy Goals- Improve the function of patient's integumentary system by progressing the wound(s) through the phases of wound healing (inflammation - proliferation - remodeling) by: Wound Therapy Goals - Improve the function of patient's integumentary system by progressing the wound(s) through the phases of wound healing by: Decrease Necrotic Tissue to: 15% Decrease Necrotic Tissue - Progress: Progressing toward goal Increase Granulation Tissue to: 85% Increase Granulation Tissue - Progress: Progressing toward goal Patient/Family will be able to : verbalize how to complete dressing change. Patient/Family Instruction Goal - Progress: Progressing toward goal Goals/treatment plan/discharge plan were made with and agreed upon by patient/family: Yes Time For Goal Achievement: 7 days Wound Therapy - Potential for Goals: Good  Goals will  be updated until maximal potential achieved or discharge criteria met.  Discharge criteria: when goals achieved, discharge from hospital, MD decision/surgical intervention, no progress towards goals, refusal/missing three consecutive treatments without notification or medical reason.  GP  Charges PT Wound Care Charges $Wound Debridement up to 20 cm: < or equal to 20 cm $ Wound Debridement each add'l 20 sqcm: 3 $PT Hydrotherapy Visit: 1 Visit      [name], PT, DPT Acute Rehabilitation Services  Office: [phone]   [name] 11/14/2022, 4:32 PM

## 2022-11-15 DIAGNOSIS — J9621 Acute and chronic respiratory failure with hypoxia: Secondary | ICD-10-CM | POA: Diagnosis not present

## 2022-11-15 DIAGNOSIS — J189 Pneumonia, unspecified organism: Secondary | ICD-10-CM | POA: Diagnosis not present

## 2022-11-15 LAB — CULTURE, BLOOD (ROUTINE X 2)
Culture: NO GROWTH
Special Requests: ADEQUATE

## 2022-11-15 LAB — BASIC METABOLIC PANEL
Anion gap: 10 (ref 5–15)
BUN: 16 mg/dL (ref 6–20)
CO2: 28 mmol/L (ref 22–32)
Calcium: 9.1 mg/dL (ref 8.9–10.3)
Chloride: 99 mmol/L (ref 98–111)
Creatinine, Ser: 0.78 mg/dL (ref 0.61–1.24)
GFR, Estimated: >60 mL/min (ref >60)
Glucose, Bld: 97 mg/dL (ref 70–99)
Potassium: 4.1 mmol/L (ref 3.5–5.1)
Sodium: 137 mmol/L (ref 135–145)

## 2022-11-15 LAB — GLUCOSE, CAPILLARY
Glucose-Capillary: 91 mg/dL (ref 70–99)
Glucose-Capillary: 104 mg/dL — ABNORMAL HIGH (ref 70–99)
Glucose-Capillary: 131 mg/dL — ABNORMAL HIGH (ref 70–99)
Glucose-Capillary: 128 mg/dL — ABNORMAL HIGH (ref 70–99)

## 2022-11-15 LAB — CBC
HCT: 39.3 % (ref 39.0–52.0)
Hemoglobin: 12.5 g/dL — ABNORMAL LOW (ref 13.0–17.0)
MCH: 25.7 pg — ABNORMAL LOW (ref 26.0–34.0)
MCHC: 31.8 g/dL (ref 30.0–36.0)
MCV: 80.7 fL (ref 80.0–100.0)
Platelets: 291 10*3/uL (ref 150–400)
RBC: 4.87 MIL/uL (ref 4.22–5.81)
RDW: 15.3 % (ref 11.5–15.5)
WBC: 12.7 10*3/uL — ABNORMAL HIGH (ref 4.0–10.5)
nRBC: 0 % (ref 0.0–0.2)

## 2022-11-15 LAB — TRIGLYCERIDES: Triglycerides: 127 mg/dL (ref <150)

## 2022-11-15 MED ORDER — FUROSEMIDE 10 MG/ML IJ SOLN
40.0000 mg | Freq: Once | INTRAMUSCULAR | Status: AC
  Administered 2022-11-15: 40 mg via INTRAVENOUS
  Filled 2022-11-15: qty 4

## 2022-11-15 NOTE — Progress Notes (Signed)
PT woke up and took himself off bipap. RT placed Pt back on heated HFNC 40L/60% while he eats.

## 2022-11-15 NOTE — Progress Notes (Signed)
RT took Pt off [place] and Pt placed on bipap per MD/PRN order while asleep. Pt seems to be tolerating it well at this time and is currently resting. RN notified to call RT when Pt wakes up to be placed back on [place]. RT will continue to monitor as needed.
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
## 2022-11-16 DIAGNOSIS — J9621 Acute and chronic respiratory failure with hypoxia: Secondary | ICD-10-CM | POA: Diagnosis not present

## 2022-11-16 DIAGNOSIS — J189 Pneumonia, unspecified organism: Secondary | ICD-10-CM

## 2022-11-16 LAB — CBC
HCT: 40.7 % (ref 39.0–52.0)
Hemoglobin: 13.4 g/dL (ref 13.0–17.0)
MCH: 25.6 pg — ABNORMAL LOW (ref 26.0–34.0)
MCHC: 32.9 g/dL (ref 30.0–36.0)
MCV: 77.7 fL — ABNORMAL LOW (ref 80.0–100.0)
Platelets: 348 10*3/uL (ref 150–400)
RBC: 5.24 MIL/uL (ref 4.22–5.81)
RDW: 15.3 % (ref 11.5–15.5)
WBC: 15.2 10*3/uL — ABNORMAL HIGH (ref 4.0–10.5)
nRBC: 0 % (ref 0.0–0.2)

## 2022-11-16 LAB — BASIC METABOLIC PANEL
Anion gap: 9 (ref 5–15)
BUN: 16 mg/dL (ref 6–20)
CO2: 27 mmol/L (ref 22–32)
Calcium: 9.5 mg/dL (ref 8.9–10.3)
Chloride: 98 mmol/L (ref 98–111)
Creatinine, Ser: 0.83 mg/dL (ref 0.61–1.24)
GFR, Estimated: >60 mL/min (ref >60)
Glucose, Bld: 91 mg/dL (ref 70–99)
Potassium: 3.8 mmol/L (ref 3.5–5.1)
Sodium: 134 mmol/L — ABNORMAL LOW (ref 135–145)

## 2022-11-16 LAB — TRIGLYCERIDES: Triglycerides: 112 mg/dL (ref <150)

## 2022-11-16 LAB — GLUCOSE, CAPILLARY: Glucose-Capillary: 92 mg/dL (ref 70–99)

## 2022-11-16 MED ORDER — CHLORDIAZEPOXIDE HCL 5 MG PO CAPS: 5.0000 mg | ORAL_CAPSULE | Freq: Three times a day (TID) | ORAL | Status: DC

## 2022-11-16 MED ORDER — NICOTINE 14 MG/24HR TD PT24: 14.0000 mg | MEDICATED_PATCH | Freq: Every day | TRANSDERMAL | Status: DC

## 2022-11-16 MED ORDER — HYDROMORPHONE HCL 1 MG/ML IJ SOLN: 0.5000 mg | INTRAMUSCULAR | Status: DC | PRN

## 2022-11-16 MED ORDER — TEMAZEPAM 15 MG PO CAPS: 15.0000 mg | ORAL_CAPSULE | Freq: Every evening | ORAL | Status: DC | PRN

## 2022-11-16 NOTE — Progress Notes (Signed)
Benjamin Leitz, NP and I had an extensive conversation with patient about his health and medical requirements. Patient stated he understood that leaving the hospital was at the risk of his health and "have to take care of things, life." Patient signed [place] paperwork. Removed I.V. and wheeled patient to hospital entrance with all belongings.
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
## 2022-11-16 NOTE — Progress Notes (Signed)
Entered patients room, patient disconnected IV and all monitoring, removed oxygen and walked to bathroom. Educated patient that this was against the doctors orders and puts him at risk of harm, Patient stated "yes, I'm going to the bathroom".

## 2022-11-16 NOTE — Progress Notes (Addendum)
Pt trying to leave ama.  Tired of being here. Cannot rest here. Has financial issues to take care of.  I expressed concern over his problems but informed him that if he left he would likely suffer a cardiac arrest from hypoxemia.  He is currently improving but on high levels of oxygen, even at rest his sat immediately drops to low 80s off oxygen.  He is still requiring antibiotics, steriods and assistance with mobility.  He is convinced to stay for now but not happy about it.   He seems depressed about the conditions here and his inability to sleep.  I offered sleep medications, however he said "I don't want to take that anymore" .   We will try to be as supportive and compassionate to his needs as possible, as if he leaves right now, he will almost certainly be at risk for worsening illness and death.    Ordering librium, nicotine patch, dilaudid prn, restoril prn.  Trial of different things to try to make him as comfortable as possible despite a difficult situation.  SW and [place].   Consider calling family to assist (SO and aunt listed in chart).   Stay away from bipap and cpap at the moment as he does not tolerate these well usually.

## 2022-11-26 ENCOUNTER — Institutional Professional Consult (permissible substitution): Payer: Medicaid Other | Admitting: Nurse Practitioner

## 2022-11-26 ENCOUNTER — Ambulatory Visit: Payer: Medicaid Other | Admitting: Student

## 2022-12-09 ENCOUNTER — Ambulatory Visit (HOSPITAL_BASED_OUTPATIENT_CLINIC_OR_DEPARTMENT_OTHER): Payer: Medicaid Other | Admitting: Internal Medicine

## 2022-12-17 ENCOUNTER — Institutional Professional Consult (permissible substitution): Payer: Medicaid Other | Admitting: Nurse Practitioner

## 2022-12-29 ENCOUNTER — Ambulatory Visit (HOSPITAL_BASED_OUTPATIENT_CLINIC_OR_DEPARTMENT_OTHER): Payer: Medicaid Other | Admitting: General Surgery

## 2023-01-02 ENCOUNTER — Encounter: Payer: Self-pay | Admitting: Nurse Practitioner

## 2023-01-15 ENCOUNTER — Ambulatory Visit (HOSPITAL_BASED_OUTPATIENT_CLINIC_OR_DEPARTMENT_OTHER): Payer: Medicaid Other | Admitting: Internal Medicine

## 2023-01-15 ENCOUNTER — Encounter (HOSPITAL_BASED_OUTPATIENT_CLINIC_OR_DEPARTMENT_OTHER): Payer: Self-pay

## 2024-11-01 ENCOUNTER — Ambulatory Visit (HOSPITAL_COMMUNITY): Payer: Self-pay | Admitting: Family
